# Patient Record
Sex: Female | Born: 1985
Health system: Southern US, Community
[De-identification: ages and names within clinical notes are randomized; demographics above are authoritative.]

## PROBLEM LIST (undated history)

## (undated) DIAGNOSIS — R519 Headache, unspecified: Secondary | ICD-10-CM

## (undated) DIAGNOSIS — J309 Allergic rhinitis, unspecified: Secondary | ICD-10-CM

## (undated) DIAGNOSIS — G8929 Other chronic pain: Secondary | ICD-10-CM

## (undated) DIAGNOSIS — N809 Endometriosis, unspecified: Secondary | ICD-10-CM

## (undated) DIAGNOSIS — T4145XA Adverse effect of unspecified anesthetic, initial encounter: Secondary | ICD-10-CM

## (undated) DIAGNOSIS — T8859XA Other complications of anesthesia, initial encounter: Secondary | ICD-10-CM

## (undated) DIAGNOSIS — E041 Nontoxic single thyroid nodule: Secondary | ICD-10-CM

## (undated) HISTORY — DX: Allergic rhinitis, unspecified: J30.9

## (undated) HISTORY — PX: LAPAROSCOPY: SHX197

## (undated) HISTORY — DX: Headache, unspecified: R51.9

## (undated) HISTORY — DX: Endometriosis, unspecified: N80.9

## (undated) HISTORY — DX: Nontoxic single thyroid nodule: E04.1

## (undated) HISTORY — PX: WISDOM TOOTH EXTRACTION: SHX21

## (undated) HISTORY — DX: Other chronic pain: G89.29

---
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
## 2019-01-28 DIAGNOSIS — Z6820 Body mass index (BMI) 20.0-20.9, adult: Secondary | ICD-10-CM | POA: Diagnosis not present

## 2019-01-28 DIAGNOSIS — N801 Endometriosis of ovary: Secondary | ICD-10-CM | POA: Diagnosis not present

## 2019-01-28 DIAGNOSIS — K130 Diseases of lips: Secondary | ICD-10-CM | POA: Diagnosis not present

## 2019-04-15 DIAGNOSIS — Z20828 Contact with and (suspected) exposure to other viral communicable diseases: Secondary | ICD-10-CM | POA: Diagnosis not present

## 2019-07-08 DIAGNOSIS — J0100 Acute maxillary sinusitis, unspecified: Secondary | ICD-10-CM | POA: Diagnosis not present

## 2019-07-08 DIAGNOSIS — Z681 Body mass index (BMI) 19 or less, adult: Secondary | ICD-10-CM | POA: Diagnosis not present

## 2019-09-16 DIAGNOSIS — Z8616 Personal history of COVID-19: Secondary | ICD-10-CM

## 2019-09-16 HISTORY — DX: Personal history of COVID-19: Z86.16

## 2019-09-22 DIAGNOSIS — U071 COVID-19: Secondary | ICD-10-CM | POA: Diagnosis not present

## 2019-09-22 DIAGNOSIS — Z20828 Contact with and (suspected) exposure to other viral communicable diseases: Secondary | ICD-10-CM | POA: Diagnosis not present

## 2019-09-22 DIAGNOSIS — Z03818 Encounter for observation for suspected exposure to other biological agents ruled out: Secondary | ICD-10-CM | POA: Diagnosis not present

## 2019-10-29 DIAGNOSIS — J309 Allergic rhinitis, unspecified: Secondary | ICD-10-CM | POA: Diagnosis not present

## 2019-10-29 DIAGNOSIS — J0191 Acute recurrent sinusitis, unspecified: Secondary | ICD-10-CM | POA: Diagnosis not present

## 2019-10-29 DIAGNOSIS — R221 Localized swelling, mass and lump, neck: Secondary | ICD-10-CM | POA: Diagnosis not present

## 2019-11-05 DIAGNOSIS — Z6821 Body mass index (BMI) 21.0-21.9, adult: Secondary | ICD-10-CM | POA: Diagnosis not present

## 2019-11-05 DIAGNOSIS — R5383 Other fatigue: Secondary | ICD-10-CM | POA: Diagnosis not present

## 2019-11-05 DIAGNOSIS — Z13220 Encounter for screening for lipoid disorders: Secondary | ICD-10-CM | POA: Diagnosis not present

## 2019-11-05 DIAGNOSIS — U071 COVID-19: Secondary | ICD-10-CM | POA: Diagnosis not present

## 2019-11-19 DIAGNOSIS — R5383 Other fatigue: Secondary | ICD-10-CM | POA: Diagnosis not present

## 2019-11-19 DIAGNOSIS — E041 Nontoxic single thyroid nodule: Secondary | ICD-10-CM | POA: Diagnosis not present

## 2019-11-19 DIAGNOSIS — R221 Localized swelling, mass and lump, neck: Secondary | ICD-10-CM | POA: Diagnosis not present

## 2019-11-19 DIAGNOSIS — J0191 Acute recurrent sinusitis, unspecified: Secondary | ICD-10-CM | POA: Diagnosis not present

## 2019-11-22 ENCOUNTER — Other Ambulatory Visit: Payer: Self-pay | Admitting: Otolaryngology

## 2019-11-22 DIAGNOSIS — E041 Nontoxic single thyroid nodule: Secondary | ICD-10-CM

## 2019-11-30 DIAGNOSIS — J0191 Acute recurrent sinusitis, unspecified: Secondary | ICD-10-CM | POA: Diagnosis not present

## 2019-11-30 DIAGNOSIS — J342 Deviated nasal septum: Secondary | ICD-10-CM | POA: Diagnosis not present

## 2019-12-02 ENCOUNTER — Ambulatory Visit
Admission: RE | Admit: 2019-12-02 | Discharge: 2019-12-02 | Disposition: A | Discharge status: Home / Self Care | Payer: BC Managed Care – PPO | Source: Ambulatory Visit | Attending: Otolaryngology | Admitting: Otolaryngology

## 2019-12-02 DIAGNOSIS — E041 Nontoxic single thyroid nodule: Secondary | ICD-10-CM | POA: Diagnosis not present

## 2019-12-08 ENCOUNTER — Other Ambulatory Visit: Payer: Self-pay | Admitting: Otolaryngology

## 2019-12-08 DIAGNOSIS — E041 Nontoxic single thyroid nodule: Secondary | ICD-10-CM

## 2019-12-16 DIAGNOSIS — C801 Malignant (primary) neoplasm, unspecified: Secondary | ICD-10-CM

## 2019-12-16 HISTORY — DX: Malignant (primary) neoplasm, unspecified: C80.1

## 2019-12-17 DIAGNOSIS — R5383 Other fatigue: Secondary | ICD-10-CM | POA: Diagnosis not present

## 2019-12-23 ENCOUNTER — Other Ambulatory Visit: Payer: Self-pay

## 2019-12-23 ENCOUNTER — Telehealth: Payer: Self-pay | Admitting: Physician Assistant

## 2019-12-23 ENCOUNTER — Ambulatory Visit
Admission: RE | Admit: 2019-12-23 | Discharge: 2019-12-23 | Disposition: A | Discharge status: Home / Self Care | Payer: BC Managed Care – PPO | Source: Ambulatory Visit | Attending: Otolaryngology | Admitting: Otolaryngology

## 2019-12-23 ENCOUNTER — Other Ambulatory Visit (HOSPITAL_COMMUNITY)
Admission: RE | Admit: 2019-12-23 | Discharge: 2019-12-23 | Disposition: A | Discharge status: Home / Self Care | Payer: BC Managed Care – PPO | Source: Ambulatory Visit | Attending: Physician Assistant | Admitting: Physician Assistant

## 2019-12-23 DIAGNOSIS — C73 Malignant neoplasm of thyroid gland: Secondary | ICD-10-CM | POA: Insufficient documentation

## 2019-12-23 DIAGNOSIS — E041 Nontoxic single thyroid nodule: Secondary | ICD-10-CM

## 2019-12-24 LAB — CYTOLOGY - NON PAP

## 2019-12-29 ENCOUNTER — Other Ambulatory Visit: Payer: Self-pay | Admitting: Physician Assistant

## 2019-12-29 ENCOUNTER — Other Ambulatory Visit (HOSPITAL_COMMUNITY): Payer: Self-pay | Admitting: Physician Assistant

## 2019-12-29 ENCOUNTER — Encounter: Payer: Self-pay | Admitting: *Deleted

## 2019-12-29 DIAGNOSIS — C73 Malignant neoplasm of thyroid gland: Secondary | ICD-10-CM

## 2019-12-29 NOTE — Progress Notes (Signed)
[facility]    Provider:  Dr Jaynee Eagles Requesting Provider: Izora Gala MD Primary Care Provider:  Sharilyn Sites, MD  CC:  Left temporal headache  HPI:  Melinda Jones is a 34 y.o. female here as requested by Izora Gala MD. for chronic headaches.  I reviewed Dr. Janeice Robinson notes, she moved from [place] this past year, she had Covid about a month ago (appointment with Dr. Constance Holster Oct 29, 2019), she has had a cough ever since, history of chronic allergies, she is allergic to multiple things, she reported having recurrent sinus infections, when she has a sinus infection she usually has left-sided periorbital maxillary pain and pressure and also a lump on the posterior lower neck at the midline which has been present and slowly getting larger for several years.  She has a past medical history of endometriosis.  Examination showed some mucosal edema in the nose worse on the right with some mucoid secretions, oral cavity normal, eyes ears hearing normal, oropharynx normal, and the neck was a lipomatous mass no thyroid nodules or enlargement, neuro examination cranial nerves II through XII were normal, she was treated for acute sinusitis with antibiotics.  At follow-up she reported her symptoms had not really changed very much, she still reported left temporal and forehead pain and pressure, her cough was not better, they proceeded with CT of the sinuses and check a thyroid level.  An ultrasound showed a 2.2 mm mass in the right thyroid that meets criteria for biopsy and she was set up for fine-needle aspiration, CT of the sinuses was negative, she had an aunt with brain cancer and she was referred to neurology for work-up.  Patient is here with parents who also provide information. Diagnosed with papillary carcinoma. She has had a consistent pressure in the left temporal lobe. Sometimes worse than others. Started in Fall of 2019. Starts left periorbital and temporal area, pressure, mild oncoming  and slowly progressive in nature and then it can last all day long. It can get throbbing and pulsating, heat makes it worse, light/sound sensitivity, nausea, hurts to move, being in a room alone helps, her twin sister has migraines and gluten may have made it worse, patient reports she does not have an aura. She also occ has a sensation in the back of her head, always on the left, so the pain spreads all along the left side, she holds the side of her face. On average 4/10, when it escalates it is a 5-6/10, can be daily. She is having headaches 20/30 headache days a month, 8 migraine days a month that are moderately severe to severe. She takes excedrin but only a few times a month. She had Covid this past Easter but that had nothing to do with the headaches.  She has nausea when the migraines are severe. No pregnacy plans. The headache can be positional with vision changes of the left eye. No other focal neurologic deficits, associated symptoms, inciting events or modifiable factors.  Reviewed notes, labs and imaging from outside physicians, which showed:  Se above  Review of Systems: Patient complains of symptoms per HPI as well as the following symptoms: thyroid cancer. Pertinent negatives and positives per HPI. All others negative.   Social History   Socioeconomic History  . Marital status: Single    Spouse name: Not on file  . Number of children: 0  . Years of education: Not on file  . Highest education level: Master's degree (e.g., MA, MS, MEng, MEd, MSW,  MBA)  Occupational History  . Not on file  Tobacco Use  . Smoking status: Never Smoker  . Smokeless tobacco: Never Used  Substance and Sexual Activity  . Alcohol use: Not Currently  . Drug use: Not Currently  . Sexual activity: Not on file  Other Topics Concern  . Not on file  Social History Narrative   lives with room mate   Caffeine, coffee 1 c   Social Determinants of Health   Financial Resource Strain:   . Difficulty of  Paying Living Expenses:   Food Insecurity:   . Worried About Charity fundraiser in the Last Year:   . Arboriculturist in the Last Year:   Transportation Needs:   . Film/video editor (Medical):   Marland Kitchen Lack of Transportation (Non-Medical):   Physical Activity:   . Days of Exercise per Week:   . Minutes of Exercise per Session:   Stress:   . Feeling of Stress :   Social Connections:   . Frequency of Communication with Friends and Family:   . Frequency of Social Gatherings with Friends and Family:   . Attends Religious Services:   . Active Member of Clubs or Organizations:   . Attends Archivist Meetings:   Marland Kitchen Marital Status:   Intimate Partner Violence:   . Fear of Current or Ex-Partner:   . Emotionally Abused:   Marland Kitchen Physically Abused:   . Sexually Abused:     Family History  Problem Relation Age of Onset  . Diabetes Mellitus I Mother   . Migraines Sister   . Other Maternal Aunt        brain tumor  . Cancer Maternal Grandmother   . Heart failure Maternal Grandfather   . Stroke Paternal Grandmother   . Dementia Paternal Grandfather     Past Medical History:  Diagnosis Date  . Allergic sinusitis   . Cancer ([place]) 12/2019   thyroid  . Chronic headaches   . Endometriosis   . History of COVID-19 09/2019  . Thyroid nodule     Patient Active Problem List   Diagnosis Date Noted  . Chronic migraine without aura without status migrainosus, not intractable 12/30/2019  . Papillary thyroid carcinoma ([place]) 12/30/2019    Past Surgical History:  Procedure Laterality Date  . LAPAROSCOPY    . WISDOM TOOTH EXTRACTION      Current Outpatient Medications  Medication Sig Dispense Refill  . levonorgestrel-ethinyl estradiol (AMETHYST) 90-20 MCG tablet     . loratadine (CLARITIN) 10 MG tablet Take 10 mg by mouth daily.    . S-Adenosylmethionine (SAM-E PO) Take by mouth.    . rizatriptan (MAXALT-MLT) 10 MG disintegrating tablet Take 1 tablet (10 mg total) by mouth as  needed for migraine. May repeat in 2 hours if needed 9 tablet 11   No current facility-administered medications for this visit.    Allergies as of 12/30/2019 - Review Complete 12/30/2019  Allergen Reaction Noted  . Amoxicillin-pot clavulanate Hives 10/29/2019  . Cefaclor Hives 10/29/2019  . Iodine Hives 12/30/2019    Vitals: BP 130/84   Pulse 68   Ht 5\' 9"  (1.753 m)   Wt 141 lb (64 kg)   BMI 20.82 kg/m  Last Weight:  Wt Readings from Last 1 Encounters:  12/30/19 141 lb (64 kg)   Last Height:   Ht Readings from Last 1 Encounters:  12/30/19 5\' 9"  (1.753 m)     Physical exam: Exam: Gen: NAD, conversant, well  nourised, well groomed                     CV: RRR, no MRG. No Carotid Bruits. No peripheral edema, warm, nontender Eyes: Conjunctivae clear without exudates or hemorrhage  Neuro: Detailed Neurologic Exam  Speech:    Speech is normal; fluent and spontaneous with normal comprehension.  Cognition:    The patient is oriented to person, place, and time;     recent and remote memory intact;     language fluent;     normal attention, concentration,     fund of knowledge Cranial Nerves:    The pupils are equal, round, and reactive to light. The fundi are normal and spontaneous venous pulsations are present. Visual fields are full to finger confrontation. Extraocular movements are intact. Trigeminal sensation is intact and the muscles of mastication are normal. The face is symmetric. The palate elevates in the midline. Hearing intact. Voice is normal. Shoulder shrug is normal. The tongue has normal motion without fasciculations.   Coordination:    No dysmetria or ataxia  Gait:    Normal native gait  Motor Observation:    No asymmetry, no atrophy, and no involuntary movements noted. Tone:    Normal muscle tone.    Posture:    Posture is normal. normal erect    Strength:    Strength is V/V in the upper and lower limbs.      Sensation: intact to LT     Reflex  Exam:  DTR's:    Deep tendon reflexes in the upper and lower extremities are normal bilaterally.   Toes:    The toes are downgoing bilaterally.   Clonus:    Clonus is absent.    Assessment/Plan: This is a really lovely 34 year old with 2 years of left-sided headaches likely migraines however given her recent diagnosis of head/neck cancer, and positional quality with some vision changes in the left eye I do think an MRI of the brain with and without contrast would be very prudent to make sure there are no metastases, space-occupying lesions, IH or other etiology in the brain, head or globe of the eye.  I had a long discussion with patient and both her parents about migraine management, options, lifestyle choices, preventative/acute management, devices on the market.  At this time we will give her acute medication, I would prefer that she have the MRI of the brain prior to deciding on preventative care and that she follows up with oncology and gets her PET scan as well prior to deciding on any preventative care at this time.   Preventative: Consider Topiramate, Ajovy (please see literature provided for many options)  Migraine journal  Recommend eye examination  MRI of the brain w/wo contrast  Consider joining facebook groups  Migraineurs often have a lot of neck stiffness or jaw clenching, be more aware, physical therapy can help  Magnesium citrate 400mg  to 600mg  daily, riboflavin 400mg  daily, Coenzyme Q 10 100mg  three times daily or Migrelief (on [employer])- Magnesium/ Vitamin B2 and feverfew     Orders Placed This Encounter  Procedures  . MR BRAIN W WO CONTRAST  . CBC  . Comprehensive metabolic panel   Meds ordered this encounter  Medications  . rizatriptan (MAXALT-MLT) 10 MG disintegrating tablet    Sig: Take 1 tablet (10 mg total) by mouth as needed for migraine. May repeat in 2 hours if needed    Dispense:  9 tablet    Refill:  11   To prevent or relieve headaches, try the  following: Cool Compress. Lie down and place a cool compress on your head.  Avoid headache triggers. If certain foods or odors seem to have triggered your migraines in the past, avoid them. A headache diary might help you identify triggers.  Include physical activity in your daily routine. Try a daily walk or other moderate aerobic exercise.  Manage stress. Find healthy ways to cope with the stressors, such as delegating tasks on your to-do list.  Practice relaxation techniques. Try deep breathing, yoga, massage and visualization.  Eat regularly. Eating regularly scheduled meals and maintaining a healthy diet might help prevent headaches. Also, drink plenty of fluids.  Follow a regular sleep schedule. Sleep deprivation might contribute to headaches Consider biofeedback. With this mind-body technique, you learn to control certain bodily functions -- such as muscle tension, heart rate and blood pressure -- to prevent headaches or reduce headache pain.    Proceed to emergency room if you experience new or worsening symptoms or symptoms do not resolve, if you have new neurologic symptoms or if headache is severe, or for any concerning symptom.   Provided education and documentation from American headache Society toolbox including articles on: chronic migraine medication overuse headache, chronic migraines, prevention of migraines, behavioral and other nonpharmacologic treatments for headache.   Cc: Sharilyn Sites, MD,  Dr. Maryln Manuel, [facility] [address] [place], [place] [address]  Phone [phone] Fax [phone]

## 2019-12-30 ENCOUNTER — Encounter: Payer: Self-pay | Admitting: Neurology

## 2019-12-30 ENCOUNTER — Ambulatory Visit: Payer: BC Managed Care – PPO | Admitting: Neurology

## 2019-12-30 ENCOUNTER — Telehealth: Payer: Self-pay | Admitting: Neurology

## 2019-12-30 ENCOUNTER — Other Ambulatory Visit: Payer: Self-pay

## 2019-12-30 VITALS — BP 130/84 | HR 68 | Ht 69.0 in | Wt 141.0 lb

## 2019-12-30 DIAGNOSIS — G43709 Chronic migraine without aura, not intractable, without status migrainosus: Secondary | ICD-10-CM | POA: Diagnosis not present

## 2019-12-30 DIAGNOSIS — C73 Malignant neoplasm of thyroid gland: Secondary | ICD-10-CM | POA: Insufficient documentation

## 2019-12-30 DIAGNOSIS — H539 Unspecified visual disturbance: Secondary | ICD-10-CM | POA: Diagnosis not present

## 2019-12-30 DIAGNOSIS — R51 Headache with orthostatic component, not elsewhere classified: Secondary | ICD-10-CM

## 2019-12-30 DIAGNOSIS — G441 Vascular headache, not elsewhere classified: Secondary | ICD-10-CM

## 2019-12-30 DIAGNOSIS — C760 Malignant neoplasm of head, face and neck: Secondary | ICD-10-CM

## 2019-12-30 MED ORDER — RIZATRIPTAN BENZOATE 10 MG PO TBDP
10.0000 mg | ORAL_TABLET | ORAL | 11 refills | Status: AC | PRN
Start: 2019-12-30 — End: ?

## 2019-12-30 NOTE — Patient Instructions (Addendum)
Acute management:Rizatriptan: Please take one tablet at the onset of your headache. If it does not improve the symptoms please take one additional tablet. Do not take more then 2 tablets in 24hrs. Do not take use more then 2 to 3 times in a week.  Preventative: Consider Topiramate, Ajovy (please see literature provided for many options)  Migraine journal  Recommend eye examination  MRI of the brain w/wo contrast  Consider joining facebook groups  Migraineurs often have a lot of neck stiffness or jaw clenching, be more aware, physical therapy can help  Magnesium citrate 400mg  to 600mg  daily, riboflavin 400mg  daily, Coenzyme Q 10 100mg  three times daily or Migrelief (on [employer])- Magnesium/ Vitamin B2 and feverfew    Migraine Headache A migraine headache is an intense, throbbing pain on one side or both sides of the head. Migraine headaches may also cause other symptoms, such as nausea, vomiting, and sensitivity to light and noise. A migraine headache can last from 4 hours to 3 days. Talk with your doctor about what things may bring on (trigger) your migraine headaches. What are the causes? The exact cause of this condition is not known. However, a migraine may be caused when nerves in the brain become irritated and release chemicals that cause inflammation of blood vessels. This inflammation causes pain. This condition may be triggered or caused by:  Drinking alcohol.  Smoking.  Taking medicines, such as: ? Medicine used to treat chest pain (nitroglycerin). ? Birth control pills. ? Estrogen. ? Certain blood pressure medicines.  Eating or drinking products that contain nitrates, glutamate, aspartame, or tyramine. Aged cheeses, chocolate, or caffeine may also be triggers.  Doing physical activity. Other things that may trigger a migraine headache include:  Menstruation.  Pregnancy.  Hunger.  Stress.  Lack of sleep or too much sleep.  Weather changes.  Fatigue. What  increases the risk? The following factors may make you more likely to experience migraine headaches:  Being a certain age. This condition is more common in people who are 35-78 years old.  Being female.  Having a family history of migraine headaches.  Being Caucasian.  Having a mental health condition, such as depression or anxiety.  Being obese. What are the signs or symptoms? The main symptom of this condition is pulsating or throbbing pain. This pain may:  Happen in any area of the head, such as on one side or both sides.  Interfere with daily activities.  Get worse with physical activity.  Get worse with exposure to bright lights or loud noises. Other symptoms may include:  Nausea.  Vomiting.  Dizziness.  General sensitivity to bright lights, loud noises, or smells. Before you get a migraine headache, you may get warning signs (an aura). An aura may include:  Seeing flashing lights or having blind spots.  Seeing bright spots, halos, or zigzag lines.  Having tunnel vision or blurred vision.  Having numbness or a tingling feeling.  Having trouble talking.  Having muscle weakness. Some people have symptoms after a migraine headache (postdromal phase), such as:  Feeling tired.  Difficulty concentrating. How is this diagnosed? A migraine headache can be diagnosed based on:  Your symptoms.  A physical exam.  Tests, such as: ? CT scan or an MRI of the head. These imaging tests can help rule out other causes of headaches. ? Taking fluid from the spine (lumbar puncture) and analyzing it (cerebrospinal fluid analysis, or CSF analysis). How is this treated? This condition may be treated with  medicines that:  Relieve pain.  Relieve nausea.  Prevent migraine headaches. Treatment for this condition may also include:  Acupuncture.  Lifestyle changes like avoiding foods that trigger migraine headaches.  Biofeedback.  Cognitive behavioral  therapy. Follow these instructions at home: Medicines  Take over-the-counter and prescription medicines only as told by your health care provider.  Ask your health care provider if the medicine prescribed to you: ? Requires you to avoid driving or using heavy machinery. ? Can cause constipation. You may need to take these actions to prevent or treat constipation:  Drink enough fluid to keep your urine pale yellow.  Take over-the-counter or prescription medicines.  Eat foods that are high in fiber, such as beans, whole grains, and fresh fruits and vegetables.  Limit foods that are high in fat and processed sugars, such as fried or sweet foods. Lifestyle  Do not drink alcohol.  Do not use any products that contain nicotine or tobacco, such as cigarettes, e-cigarettes, and chewing tobacco. If you need help quitting, ask your health care provider.  Get at least 8 hours of sleep every night.  Find ways to manage stress, such as meditation, deep breathing, or yoga. General instructions      Keep a journal to find out what may trigger your migraine headaches. For example, write down: ? What you eat and drink. ? How much sleep you get. ? Any change to your diet or medicines.  If you have a migraine headache: ? Avoid things that make your symptoms worse, such as bright lights. ? It may help to lie down in a dark, quiet room. ? Do not drive or use heavy machinery. ? Ask your health care provider what activities are safe for you while you are experiencing symptoms.  Keep all follow-up visits as told by your health care provider. This is important. Contact a health care provider if:  You develop symptoms that are different or more severe than your usual migraine headache symptoms.  You have more than 15 headache days in one month. Get help right away if:  Your migraine headache becomes severe.  Your migraine headache lasts longer than 72 hours.  You have a fever.  You have  a stiff neck.  You have vision loss.  Your muscles feel weak or like you cannot control them.  You start to lose your balance often.  You have trouble walking.  You faint.  You have a seizure. Summary  A migraine headache is an intense, throbbing pain on one side or both sides of the head. Migraines may also cause other symptoms, such as nausea, vomiting, and sensitivity to light and noise.  This condition may be treated with medicines and lifestyle changes. You may also need to avoid certain things that trigger a migraine headache.  Keep a journal to find out what may trigger your migraine headaches.  Contact your health care provider if you have more than 15 headache days in a month or you develop symptoms that are different or more severe than your usual migraine headache symptoms. This information is not intended to replace advice given to you by your health care provider. Make sure you discuss any questions you have with your health care provider. Document Revised: 09/25/2018 Document Reviewed: 07/16/2018 Elsevier Patient Education  [place].   Rizatriptan disintegrating tablets What is this medicine? RIZATRIPTAN (rye za TRIP tan) is used to treat migraines with or without aura. An aura is a strange feeling or visual disturbance that  warns you of an attack. It is not used to prevent migraines. This medicine may be used for other purposes; ask your health care provider or pharmacist if you have questions. COMMON BRAND NAME(S): Maxalt-MLT What should I tell my health care provider before I take this medicine? They need to know if you have any of these conditions:  cigarette smoker  circulation problems in fingers and toes  diabetes  heart disease  high blood pressure  high cholesterol  history of irregular heartbeat  history of stroke  kidney disease  liver disease  stomach or intestine problems  an unusual or allergic reaction to rizatriptan,  other medicines, foods, dyes, or preservatives  pregnant or trying to get pregnant  breast-feeding How should I use this medicine? Take this medicine by mouth. Follow the directions on the prescription label. Leave the tablet in the sealed blister pack until you are ready to take it. With dry hands, open the blister and gently remove the tablet. If the tablet breaks or crumbles, throw it away and take a new tablet out of the blister pack. Place the tablet in the mouth and allow it to dissolve, and then swallow. Do not cut, crush, or chew this medicine. You do not need water to take this medicine. Do not take it more often than directed. Talk to your pediatrician regarding the use of this medicine in children. While this drug may be prescribed for children as young as 6 years for selected conditions, precautions do apply. Overdosage: If you think you have taken too much of this medicine contact a poison control center or emergency room at once. NOTE: This medicine is only for you. Do not share this medicine with others. What if I miss a dose? This does not apply. This medicine is not for regular use. What may interact with this medicine? Do not take this medicine with any of the following medicines:  certain medicines for migraine headache like almotriptan, eletriptan, frovatriptan, naratriptan, rizatriptan, sumatriptan, zolmitriptan  ergot alkaloids like dihydroergotamine, ergonovine, ergotamine, methylergonovine  MAOIs like Carbex, Eldepryl, Marplan, Nardil, and Parnate This medicine may also interact with the following medications:  certain medicines for depression, anxiety, or psychotic disorders  propranolol This list may not describe all possible interactions. Give your health care provider a list of all the medicines, herbs, non-prescription drugs, or dietary supplements you use. Also tell them if you smoke, drink alcohol, or use illegal drugs. Some items may interact with your  medicine. What should I watch for while using this medicine? Visit your healthcare professional for regular checks on your progress. Tell your healthcare professional if your symptoms do not start to get better or if they get worse. You may get drowsy or dizzy. Do not drive, use machinery, or do anything that needs mental alertness until you know how this medicine affects you. Do not stand up or sit up quickly, especially if you are an older patient. This reduces the risk of dizzy or fainting spells. Alcohol may interfere with the effect of this medicine. Your mouth may get dry. Chewing sugarless gum or sucking hard candy and drinking plenty of water may help. Contact your healthcare professional if the problem does not go away or is severe. If you take migraine medicines for 10 or more days a month, your migraines may get worse. Keep a diary of headache days and medicine use. Contact your healthcare professional if your migraine attacks occur more frequently. What side effects may I notice from receiving  this medicine? Side effects that you should report to your doctor or health care professional as soon as possible:  allergic reactions like skin rash, itching or hives, swelling of the face, lips, or tongue  chest pain or chest tightness  signs and symptoms of a dangerous change in heartbeat or heart rhythm like chest pain; dizziness; fast, irregular heartbeat; palpitations; feeling faint or lightheaded; falls; breathing problems  signs and symptoms of a stroke like changes in vision; confusion; trouble speaking or understanding; severe headaches; sudden numbness or weakness of the face, arm or leg; trouble walking; dizziness; loss of balance or coordination  signs and symptoms of serotonin syndrome like irritable; confusion; diarrhea; fast or irregular heartbeat; muscle twitching; stiff muscles; trouble walking; sweating; high fever; seizures; chills; vomiting Side effects that usually do not  require medical attention (report to your doctor or health care professional if they continue or are bothersome):  diarrhea  dizziness  drowsiness  dry mouth  headache  nausea, vomiting  pain, tingling, numbness in the hands or feet  stomach pain This list may not describe all possible side effects. Call your doctor for medical advice about side effects. You may report side effects to FDA at 1-800-FDA-1088. Where should I keep my medicine? Keep out of the reach of children. Store at room temperature between 15 and 30 degrees C (59 and 86 degrees F). Protect from light and moisture. Throw away any unused medicine after the expiration date. NOTE: This sheet is a summary. It may not cover all possible information. If you have questions about this medicine, talk to your doctor, pharmacist, or health care provider.  2020 Elsevier/Gold Standard (2017-12-16 14:58:08) Rolanda Lundborg injection What is this medicine? FREMANEZUMAB (fre ma NEZ ue mab) is used to prevent migraine headaches. This medicine may be used for other purposes; ask your health care provider or pharmacist if you have questions. COMMON BRAND NAME(S): AJOVY What should I tell my health care provider before I take this medicine? They need to know if you have any of these conditions:  an unusual or allergic reaction to fremanezumab, other medicines, foods, dyes, or preservatives  pregnant or trying to get pregnant  breast-feeding How should I use this medicine? This medicine is for injection under the skin. You will be taught how to prepare and give this medicine. Use exactly as directed. Take your medicine at regular intervals. Do not take your medicine more often than directed. It is important that you put your used needles and syringes in a special sharps container. Do not put them in a trash can. If you do not have a sharps container, call your pharmacist or healthcare provider to get one. Talk to your pediatrician  regarding the use of this medicine in children. Special care may be needed. Overdosage: If you think you have taken too much of this medicine contact a poison control center or emergency room at once. NOTE: This medicine is only for you. Do not share this medicine with others. What if I miss a dose? If you miss a dose, take it as soon as you can. If it is almost time for your next dose, take only that dose. Do not take double or extra doses. What may interact with this medicine? Interactions are not expected. This list may not describe all possible interactions. Give your health care provider a list of all the medicines, herbs, non-prescription drugs, or dietary supplements you use. Also tell them if you smoke, drink alcohol, or use illegal drugs.  Some items may interact with your medicine. What should I watch for while using this medicine? Tell your doctor or healthcare professional if your symptoms do not start to get better or if they get worse. What side effects may I notice from receiving this medicine? Side effects that you should report to your doctor or health care professional as soon as possible:  allergic reactions like skin rash, itching or hives, swelling of the face, lips, or tongue Side effects that usually do not require medical attention (report these to your doctor or health care professional if they continue or are bothersome):  pain, redness, or irritation at site where injected This list may not describe all possible side effects. Call your doctor for medical advice about side effects. You may report side effects to FDA at 1-800-FDA-1088. Where should I keep my medicine? Keep out of the reach of children. You will be instructed on how to store this medicine. Throw away any unused medicine after the expiration date on the label. NOTE: This sheet is a summary. It may not cover all possible information. If you have questions about this medicine, talk to your doctor, pharmacist,  or health care provider.  2020 Elsevier/Gold Standard (2017-03-03 17:22:56) Topiramate tablets What is this medicine? TOPIRAMATE (toe PYRE a mate) is used to treat seizures in adults or children with epilepsy. It is also used for the prevention of migraine headaches. This medicine may be used for other purposes; ask your health care provider or pharmacist if you have questions. COMMON BRAND NAME(S): Topamax, Topiragen What should I tell my health care provider before I take this medicine? They need to know if you have any of these conditions:  bleeding disorders  kidney disease  lung or breathing disease, like asthma  suicidal thoughts, plans, or attempt; a previous suicide attempt by you or a family member  an unusual or allergic reaction to topiramate, other medicines, foods, dyes, or preservatives  pregnant or trying to get pregnant  breast-feeding How should I use this medicine? Take this medicine by mouth with a glass of water. Follow the directions on the prescription label. Do not cut, crush or chew this medicine. Swallow the tablets whole. You can take it with or without food. If it upsets your stomach, take it with food. Take your medicine at regular intervals. Do not take it more often than directed. Do not stop taking except on your doctor's advice. A special MedGuide will be given to you by the pharmacist with each prescription and refill. Be sure to read this information carefully each time. Talk to your pediatrician regarding the use of this medicine in children. While this drug may be prescribed for children as young as 82 years of age for selected conditions, precautions do apply. Overdosage: If you think you have taken too much of this medicine contact a poison control center or emergency room at once. NOTE: This medicine is only for you. Do not share this medicine with others. What if I miss a dose? If you miss a dose, take it as soon as you can. If your next dose is  to be taken in less than 6 hours, then do not take the missed dose. Take the next dose at your regular time. Do not take double or extra doses. What may interact with this medicine? This medicine may interact with the following medications:  acetazolamide  alcohol  antihistamines for allergy, cough, and cold  aspirin and aspirin-like medicines  atropine  birth control  pills  certain medicines for anxiety or sleep  certain medicines for bladder problems like oxybutynin, tolterodine  certain medicines for depression like amitriptyline, fluoxetine, sertraline  certain medicines for seizures like carbamazepine, phenobarbital, phenytoin, primidone, valproic acid, zonisamide  certain medicines for stomach problems like dicyclomine, hyoscyamine  certain medicines for travel sickness like scopolamine  certain medicines for Parkinson's disease like benztropine, trihexyphenidyl  certain medicines that treat or prevent blood clots like warfarin, enoxaparin, dalteparin, apixaban, dabigatran, and rivaroxaban  digoxin  general anesthetics like halothane, isoflurane, methoxyflurane, propofol  hydrochlorothiazide  ipratropium  lithium  medicines that relax muscles for surgery  metformin  narcotic medicines for pain  NSAIDs, medicines for pain and inflammation, like ibuprofen or naproxen  phenothiazines like chlorpromazine, mesoridazine, prochlorperazine, thioridazine  pioglitazone This list may not describe all possible interactions. Give your health care provider a list of all the medicines, herbs, non-prescription drugs, or dietary supplements you use. Also tell them if you smoke, drink alcohol, or use illegal drugs. Some items may interact with your medicine. What should I watch for while using this medicine? Visit your doctor or health care professional for regular checks on your progress. Tell your health care professional if your symptoms do not start to get better or if  they get worse. Do not stop taking except on your health care professional's advice. You may develop a severe reaction. Your health care professional will tell you how much medicine to take. Wear a medical ID bracelet or chain. Carry a card that describes your disease and details of your medicine and dosage times. This medicine can reduce the response of your body to heat or cold. Dress warm in cold weather and stay hydrated in hot weather. If possible, avoid extreme temperatures like saunas, hot tubs, very hot or cold showers, or activities that can cause dehydration such as vigorous exercise. Check with your health care professional if you have severe diarrhea, nausea, and vomiting, or if you sweat a lot. The loss of too much body fluid may make it dangerous for you to take this medicine. You may get drowsy or dizzy. Do not drive, use machinery, or do anything that needs mental alertness until you know how this medicine affects you. Do not stand up or sit up quickly, especially if you are an older patient. This reduces the risk of dizzy or fainting spells. Alcohol may interfere with the effect of this medicine. Avoid alcoholic drinks. Tell your health care professional right away if you have any change in your eyesight. Patients and their families should watch out for new or worsening depression or thoughts of suicide. Also watch out for sudden changes in feelings such as feeling anxious, agitated, panicky, irritable, hostile, aggressive, impulsive, severely restless, overly excited and hyperactive, or not being able to sleep. If this happens, especially at the beginning of treatment or after a change in dose, call your healthcare professional. This medicine may cause serious skin reactions. They can happen weeks to months after starting the medicine. Contact your health care provider right away if you notice fevers or flu-like symptoms with a rash. The rash may be red or purple and then turn into blisters  or peeling of the skin. Or, you might notice a red rash with swelling of the face, lips or lymph nodes in your neck or under your arms. Birth control may not work properly while you are taking this medicine. Talk to your health care professional about using an extra method of birth control. Women  should inform their health care professional if they wish to become pregnant or think they might be pregnant. There is a potential for serious side effects and harm to an unborn child. Talk to your health care professional for more information. What side effects may I notice from receiving this medicine? Side effects that you should report to your doctor or health care professional as soon as possible:  allergic reactions like skin rash, itching or hives, swelling of the face, lips, or tongue  blood in the urine  changes in vision  confusion  loss of memory  pain in lower back or side  pain when urinating  redness, blistering, peeling or loosening of the skin, including inside the mouth  signs and symptoms of bleeding such as bloody or black, tarry stools; red or dark brown urine; spitting up blood or brown material that looks like coffee grounds; red spots on the skin; unusual bruising or bleeding from the eyes, gums, or nose  signs and symptoms of increased acid in the body like breathing fast; fast heartbeat; headache; confusion; unusually weak or tired; nausea, vomiting  suicidal thoughts, mood changes  trouble speaking or understanding  unusual sweating  unusually weak or tired Side effects that usually do not require medical attention (report to your doctor or health care professional if they continue or are bothersome):  dizziness  drowsiness  fever  loss of appetite  nausea, vomiting  pain, tingling, numbness in the hands or feet  stomach pain  tiredness  upset stomach This list may not describe all possible side effects. Call your doctor for medical advice about  side effects. You may report side effects to FDA at 1-800-FDA-1088. Where should I keep my medicine? Keep out of the reach of children. Store at room temperature between 15 and 30 degrees C (59 and 86 degrees F). Throw away any unused medicine after the expiration date. NOTE: This sheet is a summary. It may not cover all possible information. If you have questions about this medicine, talk to your doctor, pharmacist, or health care provider.  2020 Elsevier/Gold Standard (2018-12-31 15:07:20)

## 2019-12-30 NOTE — Telephone Encounter (Signed)
no to the covid questions MR Brain w/wo contrast Dr. Ihor Dow Auth: [id number] (exp. 12/30/19 to 06/26/20). Patient is scheduled at [facility] for 01/04/20.  Patient informed me that she has had a allergic reaction to Iodine.

## 2019-12-30 NOTE — Telephone Encounter (Addendum)
Per Dr Jaynee Eagles called patient and advised her Dr Jaynee Eagles will call in Rx for her to take prior to MRI next week. She  verbalized understanding, appreciation.

## 2020-01-02 ENCOUNTER — Other Ambulatory Visit: Payer: Self-pay | Admitting: Neurology

## 2020-01-02 MED ORDER — PREDNISONE 50 MG PO TABS: ORAL_TABLET | ORAL | 0 refills | Status: DC

## 2020-01-02 NOTE — Telephone Encounter (Signed)
I called in the prescription, prednisone 50mg . Instructions on the prescription but please review with patient Monday: Take 1 pill 13 hours, 7 hours  and 1 hour before MRI for a total of 3 doses of prednisone. Take 50mg  of Benadryl with the prednisone 1 hour before MRI for a total of one dose of benadryl.

## 2020-01-03 NOTE — Telephone Encounter (Signed)
Spoke with patient and advised her to write down Dr Cathren Laine instructions for premeds for MRI on Wed. She stated she got paper,pen, and I reviewed all instructions with her. She verbalized understanding, appreciation.

## 2020-01-04 ENCOUNTER — Other Ambulatory Visit: Payer: BC Managed Care – PPO

## 2020-01-05 ENCOUNTER — Other Ambulatory Visit: Payer: Self-pay

## 2020-01-05 ENCOUNTER — Ambulatory Visit: Payer: BC Managed Care – PPO

## 2020-01-05 DIAGNOSIS — C760 Malignant neoplasm of head, face and neck: Secondary | ICD-10-CM

## 2020-01-05 DIAGNOSIS — G441 Vascular headache, not elsewhere classified: Secondary | ICD-10-CM

## 2020-01-05 DIAGNOSIS — H539 Unspecified visual disturbance: Secondary | ICD-10-CM | POA: Diagnosis not present

## 2020-01-05 DIAGNOSIS — R51 Headache with orthostatic component, not elsewhere classified: Secondary | ICD-10-CM | POA: Diagnosis not present

## 2020-01-05 MED ORDER — GADOBENATE DIMEGLUMINE 529 MG/ML IV SOLN
13.0000 mL | Freq: Once | INTRAVENOUS | Status: AC | PRN
  Administered 2020-01-05: 13 mL via INTRAVENOUS

## 2020-01-06 ENCOUNTER — Encounter (HOSPITAL_COMMUNITY): Payer: Self-pay

## 2020-01-06 ENCOUNTER — Encounter (HOSPITAL_COMMUNITY): Payer: BC Managed Care – PPO

## 2020-01-07 DIAGNOSIS — C73 Malignant neoplasm of thyroid gland: Secondary | ICD-10-CM | POA: Diagnosis not present

## 2020-01-07 NOTE — H&P (Signed)
HPI:   Melinda Jones is a 34 y.o. female who presents as a new Patient.   Referring Provider: Self, A Referral  Chief complaint: Cough.  HPI: She moved here from [place] this past year. She had Covid about a month ago. She has had a cough ever since. She has a history of chronic allergies. She has been allergy tested twice in [place]. She is allergic to multiple things. She has recurrent sinus infections. According to her mother her right tonsil has been larger than the left for her entire life. When she has a sinus infection she usually has left-sided periorbital and maxillary pain and pressure. She also has a lump on the posterior lower neck at the midline which has been present and slowly getting larger for several years.  PMH/Meds/All/SocHx/FamHx/ROS:   Past Medical History:  Diagnosis Date  . Endometriosis  . History of COVID-19   Past Surgical History:  Procedure Laterality Date  . LAPAROSCOPY  . WISDOM TOOTH EXTRACTION   No family history of bleeding disorders, wound healing problems or difficulty with anesthesia.   Social History   Socioeconomic History  . Marital status: Unknown  Spouse name: Not on file  . Number of children: Not on file  . Years of education: Not on file  . Highest education level: Not on file  Occupational History  . Not on file  Tobacco Use  . Smoking status: Never Smoker  . Smokeless tobacco: Never Used  Substance and Sexual Activity  . Alcohol use: Not on file  . Drug use: Not on file  . Sexual activity: Not on file  Other Topics Concern  . Not on file  Social History Narrative  . Not on file   Social Determinants of Health   Financial Resource Strain:  . Difficulty of Paying Living Expenses:  Food Insecurity:  . Worried About Charity fundraiser in the Last Year:  . Arboriculturist in the Last Year:  Transportation Needs:  . Film/video editor (Medical):  Marland Kitchen Lack of Transportation (Non-Medical):  Physical Activity:  . Days of  Exercise per Week:  . Minutes of Exercise per Session:  Stress:  . Feeling of Stress :  Social Connections:  . Frequency of Communication with Friends and Family:  . Frequency of Social Gatherings with Friends and Family:  . Attends Religious Services:  . Active Member of Clubs or Organizations:  . Attends Archivist Meetings:  Marland Kitchen Marital Status:   Current Outpatient Medications:  . AMETHYST, 28, 90-20 mcg (28) per tablet, , Disp: , Rfl:  . fluticasone propionate (FLOVENT HFA) 110 mcg/actuation inhaler, Inhale 110 mcg into the lungs 2 times daily., Disp: , Rfl:  . loratadine (CLARITIN) 10 mg tablet, Take 10 mg by mouth daily., Disp: , Rfl:   A complete ROS was performed with pertinent positives/negatives noted in the HPI. The remainder of the ROS are negative.   Physical Exam:   BP 160/96  Pulse 67  Temp 97.1 F (36.2 C)  Ht 1.753 m (5\' 9" )  Wt 64 kg (141 lb)  BMI 20.82 kg/m   General: Healthy and alert, in no distress, breathing easily. Normal affect. In a pleasant mood. Head: Normocephalic, atraumatic. No masses, or scars. Eyes: Pupils are equal, and reactive to light. Vision is grossly intact. No spontaneous or gaze nystagmus. Ears: Ear canals are clear. Tympanic membranes are intact, with normal landmarks and the middle ears are clear and healthy. Hearing: Grossly normal. Nose: Nasal cavities  reveal mucosal edema worse on the right with some mucoid secretions with some slight discoloration. Face: No masses or scars, facial nerve function is symmetric. Oral Cavity: No mucosal abnormalities are noted. Tongue with normal mobility. Dentition appears healthy. Oropharynx: Tonsils are symmetric. There are no mucosal masses identified. Tongue base appears normal and healthy. Larynx/Hypopharynx: indirect exam reveals healthy, mobile vocal cords, without mucosal lesions in the hypopharynx or larynx. Chest: Deferred Neck: 6-7 cm lipomatous mass at the base of the neck  posteriorly at the midline, no cervical adenopathy, no thyroid nodules or enlargement. Neuro: Cranial nerves II-XII with normal function. Balance: Normal gate. Other findings: none.  Independent Review of Additional Tests or Records:  none  Procedures:  none  Impression & Plans:  1 month history of chronic cough since having Covid. She was last on antibiotics for her sinuses in February. I think she may have an acute sinusitis right now. Recommend clindamycin for 10 days.We discussed the importance of eating active or live culture yoghurt 2-3 times daily while taking the antibiotics. This can help avoid GI side effects. Follow-up in 3 weeks for recheck at which point we may want to do some sinus CT scans.  Chronic allergies. Recommend we refer her to a local allergist for continued care.  Posterior neck mass likely lipoma. She can have this removed if she desires.    I had a long talk with her mother today. I explained the results of the biopsy. She is having an MRI of the brain this week requested by the neurologist for the chronic headaches. She had a PET scan scheduled today but that has now been canceled which I agree with. I explained that the best treatment for thyroid carcinoma is total thyroidectomy followed by endocrinology follow-up with radioiodine scanning and treatment if necessary. We discussed the nature of the surgery and the risks of recurrent nerve injury and hypocalcemia. I have invited her and lowered to call back at any time to ask questions otherwise we will schedule as soon as possible.

## 2020-01-10 NOTE — Progress Notes (Signed)
[id number] [address], [place] [address] Phone: [phone] Fax: [phone]      Your procedure is scheduled on Monday, August 2nd.  Report to [facility] Main Entrance "A" at 7:00 A.M., and check in at the Admitting office.  Call this number if you have problems the morning of surgery:  [phone]  Call [phone] if you have any questions prior to your surgery date Monday-Friday 8am-4pm    Remember:  Do not eat or drink after midnight the night before your surgery     Take these medicines the morning of surgery with A SIP OF WATER   Tylenol - if needed  Flonase nasal spray - if needed  Claritin - if needed    As of today, STOP taking any Aspirin (unless otherwise instructed by your surgeon) Aleve, Naproxen, Ibuprofen, Motrin, Advil, Goody's, BC's, all herbal medications, fish oil, and all vitamins.                      Do not wear jewelry, make up, or nail polish            Do not wear lotions, powders, perfumes, or deodorant.            Do not shave 48 hours prior to surgery.              Do not bring valuables to the hospital.            [facility] is not responsible for any belongings or valuables.  Do NOT Smoke (Tobacco/Vaping) or drink Alcohol 24 hours prior to your procedure If you use a CPAP at night, you may bring all equipment for your overnight stay.   Contacts, glasses, dentures or bridgework may not be worn into surgery.      For patients admitted to the hospital, discharge time will be determined by your treatment team.   Patients discharged the day of surgery will not be allowed to drive home, and someone needs to stay with them for 24 hours.    Special instructions:   [facility]- Preparing For Surgery  Before surgery, you can play an important role. Because skin is not sterile, your skin needs to be as free of germs as possible. You can reduce the number of germs on your skin by  washing with CHG (chlorahexidine gluconate) Soap before surgery.  CHG is an antiseptic cleaner which kills germs and bonds with the skin to continue killing germs even after washing.    Oral Hygiene is also important to reduce your risk of infection.  Remember - BRUSH YOUR TEETH THE MORNING OF SURGERY WITH YOUR REGULAR TOOTHPASTE  Please do not use if you have an allergy to CHG or antibacterial soaps. If your skin becomes reddened/irritated stop using the CHG.  Do not shave (including legs and underarms) for at least 48 hours prior to first CHG shower. It is OK to shave your face.  Please follow these instructions carefully.   1. Shower the NIGHT BEFORE SURGERY and the MORNING OF SURGERY with CHG Soap.   2. If you chose to wash your hair, wash your hair first as usual with your normal shampoo.  3. After you shampoo, rinse your hair and body thoroughly to remove the shampoo.  4. Use CHG as you would any other liquid soap. You can apply CHG directly to the skin and wash gently with a scrungie or a clean washcloth.  5. Apply the CHG Soap to your body ONLY FROM THE NECK DOWN.  Do not use on open wounds or open sores. Avoid contact with your eyes, ears, mouth and genitals (private parts). Wash Face and genitals (private parts)  with your normal soap.   6. Wash thoroughly, paying special attention to the area where your surgery will be performed.  7. Thoroughly rinse your body with warm water from the neck down.  8. DO NOT shower/wash with your normal soap after using and rinsing off the CHG Soap.  9. Pat yourself dry with a CLEAN TOWEL.  10. Wear CLEAN PAJAMAS to bed the night before surgery  11. Place CLEAN SHEETS on your bed the night of your first shower and DO NOT SLEEP WITH PETS.   Day of Surgery: Wear Clean/Comfortable clothing the morning of surgery Do not apply any deodorants/lotions.   Remember to brush your teeth WITH YOUR REGULAR TOOTHPASTE.   Please read over the  following fact sheets that you were given.

## 2020-01-11 ENCOUNTER — Other Ambulatory Visit: Payer: Self-pay

## 2020-01-11 ENCOUNTER — Encounter (HOSPITAL_COMMUNITY)
Admission: RE | Admit: 2020-01-11 | Discharge: 2020-01-11 | Disposition: A | Discharge status: Home / Self Care | Payer: BC Managed Care – PPO | Source: Ambulatory Visit | Attending: Otolaryngology | Admitting: Otolaryngology

## 2020-01-11 ENCOUNTER — Encounter (HOSPITAL_COMMUNITY): Payer: Self-pay

## 2020-01-11 DIAGNOSIS — Z01812 Encounter for preprocedural laboratory examination: Secondary | ICD-10-CM | POA: Diagnosis not present

## 2020-01-11 LAB — CBC
HCT: 47.7 % — ABNORMAL HIGH (ref 36.0–46.0)
Hemoglobin: 15.2 g/dL — ABNORMAL HIGH (ref 12.0–15.0)
MCH: 29.7 pg (ref 26.0–34.0)
MCHC: 31.9 g/dL (ref 30.0–36.0)
MCV: 93.2 fL (ref 80.0–100.0)
Platelets: 314 10*3/uL (ref 150–400)
RBC: 5.12 MIL/uL — ABNORMAL HIGH (ref 3.87–5.11)
RDW: 11.9 % (ref 11.5–15.5)
WBC: 9.9 10*3/uL (ref 4.0–10.5)
nRBC: 0 % (ref 0.0–0.2)

## 2020-01-11 LAB — BASIC METABOLIC PANEL
Anion gap: 12 (ref 5–15)
BUN: 5 mg/dL — ABNORMAL LOW (ref 6–20)
CO2: 26 mmol/L (ref 22–32)
Calcium: 9.2 mg/dL (ref 8.9–10.3)
Chloride: 101 mmol/L (ref 98–111)
Creatinine, Ser: 0.62 mg/dL (ref 0.44–1.00)
GFR calc Af Amer: >60 mL/min (ref >60)
GFR calc non Af Amer: >60 mL/min (ref >60)
Glucose, Bld: 138 mg/dL — ABNORMAL HIGH (ref 70–99)
Potassium: 3.7 mmol/L (ref 3.5–5.1)
Sodium: 139 mmol/L (ref 135–145)

## 2020-01-11 NOTE — Progress Notes (Signed)
PCP - Dr. Eddie Candle Cardiologist - Denies  Chest x-ray - Not indicated EKG - Not indicated Stress Test - Denies ECHO - Denies Cardiac Cath - Denies  Sleep Study - Denies  DM - Denies  COVID TEST- 01/11/20  Anesthesia review: No  Patient denies shortness of breath, fever, cough and chest pain at PAT appointment   All instructions explained to the patient, with a verbal understanding of the material. Patient agrees to go over the instructions while at home for a better understanding. Patient also instructed to self quarantine after being tested for COVID-19. The opportunity to ask questions was provided.

## 2020-01-14 ENCOUNTER — Other Ambulatory Visit (HOSPITAL_COMMUNITY)
Admission: RE | Admit: 2020-01-14 | Discharge: 2020-01-14 | Disposition: A | Discharge status: Home / Self Care | Payer: BC Managed Care – PPO | Source: Ambulatory Visit | Attending: Otolaryngology | Admitting: Otolaryngology

## 2020-01-14 DIAGNOSIS — Z20822 Contact with and (suspected) exposure to covid-19: Secondary | ICD-10-CM | POA: Insufficient documentation

## 2020-01-14 DIAGNOSIS — Z01812 Encounter for preprocedural laboratory examination: Secondary | ICD-10-CM | POA: Diagnosis not present

## 2020-01-14 LAB — SARS CORONAVIRUS 2 (TAT 6-24 HRS): SARS Coronavirus 2: NEGATIVE

## 2020-01-17 ENCOUNTER — Encounter (HOSPITAL_COMMUNITY): Payer: Self-pay | Admitting: Otolaryngology

## 2020-01-17 ENCOUNTER — Ambulatory Visit (HOSPITAL_COMMUNITY): Payer: BC Managed Care – PPO | Admitting: Certified Registered Nurse Anesthetist

## 2020-01-17 ENCOUNTER — Other Ambulatory Visit: Payer: Self-pay

## 2020-01-17 ENCOUNTER — Encounter (HOSPITAL_COMMUNITY)
Admission: RE | Disposition: A | Discharge status: Home / Self Care | Payer: Self-pay | Source: Ambulatory Visit | Attending: Otolaryngology

## 2020-01-17 ENCOUNTER — Observation Stay (HOSPITAL_COMMUNITY)
Admission: RE | Admit: 2020-01-17 | Discharge: 2020-01-18 | Disposition: A | Discharge status: Home / Self Care | Payer: BC Managed Care – PPO | Source: Ambulatory Visit | Attending: Otolaryngology | Admitting: Otolaryngology

## 2020-01-17 DIAGNOSIS — Z9889 Other specified postprocedural states: Secondary | ICD-10-CM

## 2020-01-17 DIAGNOSIS — Z8616 Personal history of COVID-19: Secondary | ICD-10-CM | POA: Diagnosis not present

## 2020-01-17 DIAGNOSIS — E890 Postprocedural hypothyroidism: Secondary | ICD-10-CM

## 2020-01-17 DIAGNOSIS — Z9089 Acquired absence of other organs: Secondary | ICD-10-CM

## 2020-01-17 DIAGNOSIS — C73 Malignant neoplasm of thyroid gland: Principal | ICD-10-CM | POA: Insufficient documentation

## 2020-01-17 DIAGNOSIS — G43709 Chronic migraine without aura, not intractable, without status migrainosus: Secondary | ICD-10-CM | POA: Diagnosis not present

## 2020-01-17 DIAGNOSIS — R05 Cough: Secondary | ICD-10-CM | POA: Diagnosis not present

## 2020-01-17 HISTORY — PX: THYROIDECTOMY: SHX17

## 2020-01-17 HISTORY — DX: Other complications of anesthesia, initial encounter: T88.59XA

## 2020-01-17 LAB — GLUCOSE, CAPILLARY: Glucose-Capillary: 163 mg/dL — ABNORMAL HIGH (ref 70–99)

## 2020-01-17 LAB — CALCIUM
Calcium: 8.2 mg/dL — ABNORMAL LOW (ref 8.9–10.3)
Calcium: 7.8 mg/dL — ABNORMAL LOW (ref 8.9–10.3)

## 2020-01-17 LAB — POCT PREGNANCY, URINE: Preg Test, Ur: NEGATIVE

## 2020-01-17 SURGERY — THYROIDECTOMY
Anesthesia: General | Site: Neck | Laterality: Bilateral

## 2020-01-17 MED ORDER — DEXTROSE-NACL 5-0.9 % IV SOLN: INTRAVENOUS | Status: DC

## 2020-01-17 MED ORDER — ORAL CARE MOUTH RINSE: 15.0000 mL | Freq: Once | OROMUCOSAL | Status: AC

## 2020-01-17 MED ORDER — 0.9 % SODIUM CHLORIDE (POUR BTL) OPTIME
TOPICAL | Status: DC | PRN
  Administered 2020-01-17: 1000 mL

## 2020-01-17 MED ORDER — DEXAMETHASONE SODIUM PHOSPHATE 10 MG/ML IJ SOLN
INTRAMUSCULAR | Status: AC
  Filled 2020-01-17: qty 1

## 2020-01-17 MED ORDER — HYDROCODONE-ACETAMINOPHEN 5-325 MG PO TABS
1.0000 | ORAL_TABLET | ORAL | Status: DC | PRN
  Administered 2020-01-18: 1 via ORAL
  Filled 2020-01-17 (×2): qty 1

## 2020-01-17 MED ORDER — LORATADINE 10 MG PO TABS
10.0000 mg | ORAL_TABLET | Freq: Every day | ORAL | Status: DC
  Administered 2020-01-18: 10 mg via ORAL
  Filled 2020-01-17: qty 1

## 2020-01-17 MED ORDER — PROMETHAZINE HCL 25 MG PO TABS: 25.0000 mg | ORAL_TABLET | Freq: Four times a day (QID) | ORAL | Status: DC | PRN

## 2020-01-17 MED ORDER — LIDOCAINE-EPINEPHRINE 1 %-1:100000 IJ SOLN
INTRAMUSCULAR | Status: AC
  Filled 2020-01-17: qty 1

## 2020-01-17 MED ORDER — LIDOCAINE 20MG/ML (2%) 15 ML SYRINGE OPTIME
INTRAMUSCULAR | Status: DC | PRN
  Administered 2020-01-17: 60 mg via INTRAVENOUS

## 2020-01-17 MED ORDER — OXYCODONE HCL 5 MG PO TABS: 5.0000 mg | ORAL_TABLET | Freq: Once | ORAL | Status: DC | PRN

## 2020-01-17 MED ORDER — SUCCINYLCHOLINE CHLORIDE 200 MG/10ML IV SOSY
PREFILLED_SYRINGE | INTRAVENOUS | Status: AC
  Filled 2020-01-17: qty 10

## 2020-01-17 MED ORDER — PROMETHAZINE HCL 25 MG RE SUPP
25.0000 mg | Freq: Four times a day (QID) | RECTAL | Status: DC | PRN
  Filled 2020-01-17: qty 1

## 2020-01-17 MED ORDER — CHLORHEXIDINE GLUCONATE 0.12 % MT SOLN
15.0000 mL | Freq: Once | OROMUCOSAL | Status: AC
  Administered 2020-01-17: 15 mL via OROMUCOSAL
  Filled 2020-01-17: qty 15

## 2020-01-17 MED ORDER — FENTANYL CITRATE (PF) 100 MCG/2ML IJ SOLN
INTRAMUSCULAR | Status: DC | PRN
  Administered 2020-01-17 (×5): 50 ug via INTRAVENOUS

## 2020-01-17 MED ORDER — ONDANSETRON HCL 4 MG/2ML IJ SOLN
INTRAMUSCULAR | Status: DC | PRN
  Administered 2020-01-17: 4 mg via INTRAVENOUS

## 2020-01-17 MED ORDER — EPHEDRINE 5 MG/ML INJ
INTRAVENOUS | Status: AC
  Filled 2020-01-17: qty 10

## 2020-01-17 MED ORDER — FENTANYL CITRATE (PF) 250 MCG/5ML IJ SOLN
INTRAMUSCULAR | Status: AC
  Filled 2020-01-17: qty 5

## 2020-01-17 MED ORDER — HYDROMORPHONE HCL 1 MG/ML IJ SOLN
INTRAMUSCULAR | Status: AC
  Filled 2020-01-17: qty 1

## 2020-01-17 MED ORDER — ONDANSETRON HCL 4 MG/2ML IJ SOLN: 4.0000 mg | Freq: Once | INTRAMUSCULAR | Status: DC | PRN

## 2020-01-17 MED ORDER — FENTANYL CITRATE (PF) 100 MCG/2ML IJ SOLN
25.0000 ug | INTRAMUSCULAR | Status: DC | PRN
  Administered 2020-01-17 (×2): 25 ug via INTRAVENOUS

## 2020-01-17 MED ORDER — HYDROMORPHONE HCL 1 MG/ML IJ SOLN
0.2500 mg | INTRAMUSCULAR | Status: DC | PRN
  Administered 2020-01-17 (×2): 0.5 mg via INTRAVENOUS

## 2020-01-17 MED ORDER — PHENYLEPHRINE 40 MCG/ML (10ML) SYRINGE FOR IV PUSH (FOR BLOOD PRESSURE SUPPORT)
PREFILLED_SYRINGE | INTRAVENOUS | Status: AC
  Filled 2020-01-17: qty 10

## 2020-01-17 MED ORDER — FENTANYL CITRATE (PF) 100 MCG/2ML IJ SOLN
INTRAMUSCULAR | Status: AC
  Administered 2020-01-17: 50 ug via INTRAVENOUS
  Filled 2020-01-17: qty 2

## 2020-01-17 MED ORDER — IBUPROFEN 100 MG/5ML PO SUSP
400.0000 mg | Freq: Four times a day (QID) | ORAL | Status: DC | PRN
  Administered 2020-01-17 – 2020-01-18 (×4): 400 mg via ORAL
  Filled 2020-01-17 (×4): qty 20

## 2020-01-17 MED ORDER — LIDOCAINE 2% (20 MG/ML) 5 ML SYRINGE
INTRAMUSCULAR | Status: AC
  Filled 2020-01-17: qty 5

## 2020-01-17 MED ORDER — HYDROMORPHONE HCL 1 MG/ML IJ SOLN
INTRAMUSCULAR | Status: AC
  Administered 2020-01-17: 0.5 mg via INTRAVENOUS
  Filled 2020-01-17: qty 1

## 2020-01-17 MED ORDER — LEVONORGESTREL-ETHINYL ESTRAD 90-20 MCG PO TABS
1.0000 | ORAL_TABLET | Freq: Every day | ORAL | Status: DC
  Administered 2020-01-17: 1 via ORAL

## 2020-01-17 MED ORDER — MIDAZOLAM HCL 5 MG/5ML IJ SOLN
INTRAMUSCULAR | Status: DC | PRN
  Administered 2020-01-17: 2 mg via INTRAVENOUS

## 2020-01-17 MED ORDER — OXYCODONE HCL 5 MG/5ML PO SOLN: 5.0000 mg | Freq: Once | ORAL | Status: DC | PRN

## 2020-01-17 MED ORDER — HYDRALAZINE HCL 20 MG/ML IJ SOLN
INTRAMUSCULAR | Status: AC
  Filled 2020-01-17: qty 1

## 2020-01-17 MED ORDER — MIDAZOLAM HCL 2 MG/2ML IJ SOLN
INTRAMUSCULAR | Status: AC
  Filled 2020-01-17: qty 2

## 2020-01-17 MED ORDER — ROCURONIUM BROMIDE 10 MG/ML (PF) SYRINGE
PREFILLED_SYRINGE | INTRAVENOUS | Status: DC | PRN
  Administered 2020-01-17: 50 mg via INTRAVENOUS

## 2020-01-17 MED ORDER — MORPHINE SULFATE (PF) 2 MG/ML IV SOLN
2.0000 mg | INTRAVENOUS | Status: DC | PRN
  Administered 2020-01-17: 2 mg via INTRAVENOUS
  Filled 2020-01-17: qty 1

## 2020-01-17 MED ORDER — FLUTICASONE PROPIONATE 50 MCG/ACT NA SUSP: 1.0000 | Freq: Every day | NASAL | Status: DC | PRN

## 2020-01-17 MED ORDER — LACTATED RINGERS IV SOLN: INTRAVENOUS | Status: DC

## 2020-01-17 MED ORDER — PROPOFOL 10 MG/ML IV BOLUS
INTRAVENOUS | Status: AC
  Filled 2020-01-17: qty 40

## 2020-01-17 MED ORDER — SUGAMMADEX SODIUM 200 MG/2ML IV SOLN
INTRAVENOUS | Status: DC | PRN
  Administered 2020-01-17: 127 mg via INTRAVENOUS

## 2020-01-17 MED ORDER — ROCURONIUM BROMIDE 10 MG/ML (PF) SYRINGE
PREFILLED_SYRINGE | INTRAVENOUS | Status: AC
  Filled 2020-01-17: qty 10

## 2020-01-17 MED ORDER — ONDANSETRON HCL 4 MG/2ML IJ SOLN
INTRAMUSCULAR | Status: AC
  Filled 2020-01-17: qty 2

## 2020-01-17 MED ORDER — LEVOTHYROXINE SODIUM 100 MCG PO TABS
100.0000 ug | ORAL_TABLET | Freq: Every day | ORAL | Status: DC
  Administered 2020-01-18: 100 ug via ORAL
  Filled 2020-01-17: qty 1

## 2020-01-17 MED ORDER — PROPOFOL 10 MG/ML IV BOLUS
INTRAVENOUS | Status: DC | PRN
  Administered 2020-01-17: 200 mg via INTRAVENOUS

## 2020-01-17 MED ORDER — HYDRALAZINE HCL 20 MG/ML IJ SOLN
10.0000 mg | Freq: Once | INTRAMUSCULAR | Status: AC
  Administered 2020-01-17: 10 mg via INTRAVENOUS

## 2020-01-17 MED ORDER — DEXAMETHASONE SODIUM PHOSPHATE 10 MG/ML IJ SOLN
INTRAMUSCULAR | Status: DC | PRN
  Administered 2020-01-17: 4 mg via INTRAVENOUS

## 2020-01-17 SURGICAL SUPPLY — 40 items
ADH SKN CLS APL DERMABOND .7 (GAUZE/BANDAGES/DRESSINGS) ×1
BLADE SURG 15 STRL LF DISP TIS (BLADE) IMPLANT
BLADE SURG 15 STRL SS (BLADE)
CANISTER SUCT 3000ML PPV (MISCELLANEOUS) ×2 IMPLANT
CLEANER TIP ELECTROSURG 2X2 (MISCELLANEOUS) ×2 IMPLANT
CNTNR URN SCR LID CUP LEK RST (MISCELLANEOUS) ×1 IMPLANT
CONT SPEC 4OZ STRL OR WHT (MISCELLANEOUS) ×2
CORD BIPOLAR FORCEPS 12FT (ELECTRODE) ×2 IMPLANT
COVER SURGICAL LIGHT HANDLE (MISCELLANEOUS) ×2 IMPLANT
COVER WAND RF STERILE (DRAPES) ×2 IMPLANT
DERMABOND ADVANCED (GAUZE/BANDAGES/DRESSINGS) ×1
DERMABOND ADVANCED .7 DNX12 (GAUZE/BANDAGES/DRESSINGS) ×1 IMPLANT
DRAIN HEMOVAC 7FR (DRAIN) IMPLANT
DRAIN SNY 10 ROU (WOUND CARE) ×2 IMPLANT
DRAPE HALF SHEET 40X57 (DRAPES) ×2 IMPLANT
ELECT COATED BLADE 2.86 ST (ELECTRODE) ×2 IMPLANT
ELECT REM PT RETURN 9FT ADLT (ELECTROSURGICAL) ×2
ELECTRODE REM PT RTRN 9FT ADLT (ELECTROSURGICAL) ×1 IMPLANT
EVACUATOR SILICONE 100CC (DRAIN) ×2 IMPLANT
FORCEPS BIPOLAR SPETZLER 8 1.0 (NEUROSURGERY SUPPLIES) ×2 IMPLANT
GAUZE 4X4 16PLY RFD (DISPOSABLE) ×2 IMPLANT
GLOVE BIO SURGEON STRL SZ 6.5 (GLOVE) ×2 IMPLANT
GLOVE ECLIPSE 7.5 STRL STRAW (GLOVE) ×2 IMPLANT
GOWN STRL REUS W/ TWL LRG LVL3 (GOWN DISPOSABLE) ×2 IMPLANT
GOWN STRL REUS W/TWL LRG LVL3 (GOWN DISPOSABLE) ×4
KIT BASIN OR (CUSTOM PROCEDURE TRAY) ×2 IMPLANT
KIT TURNOVER KIT B (KITS) ×2 IMPLANT
NEEDLE PRECISIONGLIDE 27X1.5 (NEEDLE) ×2 IMPLANT
NS IRRIG 1000ML POUR BTL (IV SOLUTION) ×2 IMPLANT
PAD ARMBOARD 7.5X6 YLW CONV (MISCELLANEOUS) ×4 IMPLANT
PENCIL FOOT CONTROL (ELECTRODE) ×2 IMPLANT
SHEARS HARMONIC 9CM CVD (BLADE) ×2 IMPLANT
STAPLER VISISTAT 35W (STAPLE) ×2 IMPLANT
SUT CHROMIC 3 0 PS 2 (SUTURE) ×2 IMPLANT
SUT CHROMIC 4 0 PS 2 18 (SUTURE) IMPLANT
SUT ETHILON 3 0 PS 1 (SUTURE) ×2 IMPLANT
SUT SILK 3 0 REEL (SUTURE) ×2 IMPLANT
SUT SILK 4 0 REEL (SUTURE) ×2 IMPLANT
TOWEL GREEN STERILE FF (TOWEL DISPOSABLE) ×2 IMPLANT
TRAY ENT MC OR (CUSTOM PROCEDURE TRAY) ×2 IMPLANT

## 2020-01-17 NOTE — Interval H&P Note (Signed)
History and Physical Interval Note:  01/17/2020 8:14 AM  Melinda Jones  has presented today for surgery, with the diagnosis of Thyroid cancer.  The various methods of treatment have been discussed with the patient and family. After consideration of risks, benefits and other options for treatment, the patient has consented to  Procedure(s): THYROIDECTOMY/TOTAL (Bilateral) as a surgical intervention.  The patient's history has been reviewed, patient examined, no change in status, stable for surgery.  I have reviewed the patient's chart and labs.  Questions were answered to the patient's satisfaction.     Izora Gala

## 2020-01-17 NOTE — Op Note (Signed)
OPERATIVE REPORT  DATE OF SURGERY: 01/17/2020  PATIENT:  Melinda Jones,  34 y.o. female  PRE-OPERATIVE DIAGNOSIS:  Thyroid cancer  POST-OPERATIVE DIAGNOSIS:  Thyroid cancer  PROCEDURE:  Procedure(s): THYROIDECTOMY/TOTAL  SURGEON:  Beckie Salts, MD  ASSISTANTS: Jolene Provost PA  ANESTHESIA:   General   EBL: 30 ml  DRAINS: 10 French round JP  LOCAL MEDICATIONS USED:  None  SPECIMEN: Total thyroidectomy, suture marks right lobe  COUNTS:  Correct  PROCEDURE DETAILS: The patient was taken to the operating room and placed on the operating table in the supine position. A shoulder roll was placed beneath the shoulder blades and the neck was extended. The neck was prepped and draped in a standard fashion. A low collar transverse incision was outlined with a marking pen and was incised with electrocautery. Dissection was continued down through the platysma layer.  Self-retaining retractors were used throughout the case.  The midline fascia was divided.  The thyroid isthmus was exposed.  The strap muscles were reflected laterally.  The right side was dissected first.  The superior pole was brought down and the vasculature was identified divided between clamps using the harmonic dissector.  As the superior pole was brought inferiorly and medially the recurrent nerve was identified and all branches were preserved.  The middle thyroid vein and lower vasculature were also divided using the harmonic dissector.  There is ligament was dissected off the trachea as the specimen was brought forward.  Similar dissection was performed on the left.  A thin pyramidal lobe was dissected inferiorly and kept with the specimen.  On the left side a putative superior and inferior parathyroid were identified and preserved with a blood supply.  The recurrent nerve was identified and preserved as well.  There is a firm nodule within the midportion of the right lobe.  No other pathologic findings were noted.   Specimen was removed and sent for pathologic evaluation.  Pretracheal region was palpated and inspected there were no lymph nodes identified.  The wound was irrigated with saline.  Drain was inserted through a separate stab incision inferiorly.    The drain was secured in place with a nylon suture. The midline fascia was reapproximated with interrupted chromic suture. The platysma layer was also reapproximated with interrupted chromic suture. A running subcuticular closure was accomplished. Dermabond was used on the skin. The drain was charged. The patient was awakened, extubated and transferred to recovery in stable condition.   PATIENT DISPOSITION:  To PACU, stable

## 2020-01-17 NOTE — Transfer of Care (Signed)
Immediate Anesthesia Transfer of Care Note  Patient: Melinda Jones  Procedure(s) Performed: Gilda Crease (Bilateral Neck)  Patient Location: PACU  Anesthesia Type:General  Level of Consciousness: awake and patient cooperative  Airway & Oxygen Therapy: Patient Spontanous Breathing  Post-op Assessment: Report given to RN, Post -op Vital signs reviewed and stable and Patient moving all extremities X 4  Post vital signs: Reviewed and stable  Last Vitals:  Vitals Value Taken Time  BP    Temp    Pulse    Resp    SpO2      Last Pain:  Vitals:   01/17/20 0757  TempSrc:   PainSc: 0-No pain      Patients Stated Pain Goal: 3 (02/63/78 5885)  Complications: No complications documented.

## 2020-01-17 NOTE — Progress Notes (Signed)
   ENT Progress Note: s/p Procedure(s): THYROIDECTOMY/TOTAL   Subjective: C/O Mod neck pain  Objective: Vital signs in last 24 hours: Temp:  [97.4 F (36.3 C)-99 F (37.2 C)] 97.4 F (36.3 C) (08/02 1344) Pulse Rate:  [58-89] 65 (08/02 1344) Resp:  [12-20] 14 (08/02 1344) BP: (124-166)/(74-121) 129/82 (08/02 1344) SpO2:  [97 %-100 %] 100 % (08/02 1344) Weight:  [63.5 kg] 63.5 kg (08/02 0710) Weight change:     Intake/Output from previous day: No intake/output data recorded. Intake/Output this shift: Total I/O In: 850 [I.V.:850] Out: 30 [Blood:30]  Labs: No results for input(s): WBC, HGB, HCT, PLT in the last 72 hours. Recent Labs    01/17/20 1436  CALCIUM 8.2*    Studies/Results: No results found.   PHYSICAL EXAM: Good voice No swelling, JP inplace and functional   Assessment/Plan: Pt stable Monitor CA levels PO as tolerated Cont pain meds    Jerrell Belfast 01/17/2020, 5:17 PM

## 2020-01-17 NOTE — Anesthesia Preprocedure Evaluation (Signed)
Anesthesia Evaluation  Patient identified by MRN, date of birth, ID band Patient awake    Reviewed: Allergy & Precautions, NPO status , Patient's Chart, lab work & pertinent test results  History of Anesthesia Complications Negative for: history of anesthetic complications  Airway Mallampati: III  TM Distance: >3 FB Neck ROM: Full    Dental  (+) Teeth Intact   Pulmonary neg pulmonary ROS,    Pulmonary exam normal        Cardiovascular negative cardio ROS Normal cardiovascular exam     Neuro/Psych  Headaches, negative psych ROS   GI/Hepatic negative GI ROS, Neg liver ROS,   Endo/Other  negative endocrine ROS  Renal/GU negative Renal ROS  negative genitourinary   Musculoskeletal negative musculoskeletal ROS (+)   Abdominal   Peds  Hematology negative hematology ROS (+)   Anesthesia Other Findings  Papillary thyroid cancer  Reproductive/Obstetrics negative OB ROS                             Anesthesia Physical Anesthesia Plan  ASA: II  Anesthesia Plan: General   Post-op Pain Management:    Induction: Intravenous  PONV Risk Score and Plan: 3 and Ondansetron, Dexamethasone, Treatment may vary due to age or medical condition and Midazolam  Airway Management Planned: Oral ETT  Additional Equipment: None  Intra-op Plan:   Post-operative Plan: Extubation in OR  Informed Consent: I have reviewed the patients History and Physical, chart, labs and discussed the procedure including the risks, benefits and alternatives for the proposed anesthesia with the patient or authorized representative who has indicated his/her understanding and acceptance.     Dental advisory given  Plan Discussed with:   Anesthesia Plan Comments:         Anesthesia Quick Evaluation

## 2020-01-17 NOTE — Anesthesia Postprocedure Evaluation (Signed)
Anesthesia Post Note  Patient: Melinda Jones  Procedure(s) Performed: THYROIDECTOMY/TOTAL (Bilateral Neck)     Patient location during evaluation: PACU Anesthesia Type: General Level of consciousness: awake and alert Pain management: pain level controlled Vital Signs Assessment: post-procedure vital signs reviewed and stable Respiratory status: spontaneous breathing, nonlabored ventilation and respiratory function stable Cardiovascular status: blood pressure returned to baseline and stable Postop Assessment: no apparent nausea or vomiting Anesthetic complications: no   No complications documented.  Last Vitals:  Vitals:   01/17/20 1320 01/17/20 1325  BP:    Pulse: 81 73  Resp: 12 13  Temp:    SpO2: 99% 99%    Last Pain:  Vitals:   01/17/20 1325  TempSrc:   PainSc: 4     LLE Motor Response: Purposeful movement (01/17/20 1325)   RLE Motor Response: Purposeful movement (01/17/20 1325)        Lidia Collum

## 2020-01-17 NOTE — Anesthesia Procedure Notes (Signed)
Procedure Name: Intubation Date/Time: 01/17/2020 8:52 AM Performed by: Lowella Dell, CRNA Pre-anesthesia Checklist: Patient identified, Emergency Drugs available, Suction available and Patient being monitored Patient Re-evaluated:Patient Re-evaluated prior to induction Oxygen Delivery Method: Circle System Utilized Preoxygenation: Pre-oxygenation with 100% oxygen Induction Type: IV induction Ventilation: Mask ventilation without difficulty Laryngoscope Size: Mac and 3 Grade View: Grade I Tube type: Oral Number of attempts: 1 Airway Equipment and Method: Stylet Placement Confirmation: ETT inserted through vocal cords under direct vision,  positive ETCO2 and breath sounds checked- equal and bilateral Secured at: 22 cm Tube secured with: Tape Dental Injury: Teeth and Oropharynx as per pre-operative assessment

## 2020-01-17 NOTE — Progress Notes (Signed)
Melinda Jones is a 34 y.o. female patient admitted. Awake, alert - oriented  X 4 - no acute distress noted.  VSS - Blood pressure 119/82, pulse 71, temperature 97.6 F (36.4 C), temperature source Oral, resp. rate 17, height 5\' 9"  (1.753 m), weight 63.5 kg, last menstrual period 10/06/2018, SpO2 100 %, unknown if currently breastfeeding.    IV in place, occlusive dsg intact without redness. Surgical site to neck is secured with dermabond. No drainage noted JP drain intact empty at this time. Orientation to room, and floor completed.  Admission INP armband ID verified with patient/family, and in place.   SR up x 2, fall assessment complete, with patient and family able to verbalize understanding of risk associated with falls, and verbalized understanding to call nsg before up out of bed.  Call light within reach, patient able to voice, and demonstrate understanding. No evidence of skin break down noted on exam.  Admission nurse notified of admission.     Will cont to eval and treat per MD orders.  Hezzie Bump, RN 01/17/2020 8:36 PM

## 2020-01-18 ENCOUNTER — Encounter (HOSPITAL_COMMUNITY): Payer: Self-pay | Admitting: Otolaryngology

## 2020-01-18 LAB — CALCIUM: Calcium: 7.3 mg/dL — ABNORMAL LOW (ref 8.9–10.3)

## 2020-01-18 MED ORDER — PROMETHAZINE HCL 25 MG RE SUPP
25.0000 mg | Freq: Four times a day (QID) | RECTAL | 1 refills | Status: AC | PRN
Start: 2020-01-18 — End: ?

## 2020-01-18 MED ORDER — HYDROCODONE-ACETAMINOPHEN 7.5-325 MG PO TABS: 1.0000 | ORAL_TABLET | Freq: Four times a day (QID) | ORAL | 0 refills | Status: DC | PRN

## 2020-01-18 MED ORDER — LEVOTHYROXINE SODIUM 100 MCG PO TABS: 100.0000 ug | ORAL_TABLET | Freq: Every day | ORAL | 6 refills | Status: AC

## 2020-01-18 NOTE — Discharge Summary (Signed)
Physician Discharge Summary  Patient ID: Melinda Jones MRN: [id number] DOB/AGE: [birth year] 34 y.o.  Admit date: 01/17/2020 Discharge date: 01/18/2020  Admission Diagnoses: Papillary thyroid cancer  Discharge Diagnoses:  Active Problems:   S/P thyroidectomy   Discharged Condition: good  Hospital Course: No complications.  Calcium levels dropped postoperatively but not precipitously.  She was asymptomatic at the time of discharge.  We will test again in 1 to 2 days.  Consults: none  Significant Diagnostic Studies: none  Treatments: surgery: Total thyroidectomy  Discharge Exam: Blood pressure 104/73, pulse 73, temperature 98.4 F (36.9 C), temperature source Oral, resp. rate 16, height 5\' 9"  (1.753 m), weight 63.5 kg, last menstrual period 10/06/2018, SpO2 99 %, unknown if currently breastfeeding. PHYSICAL EXAM: Awake and alert.  Voice sounds excellent.  Surgical site looks perfect.  Drain removed.  [place] sign negative.  Disposition: Discharge disposition: 01-Home or Self Care       Discharge Instructions    Diet - low sodium heart healthy   Complete by: As directed    Increase activity slowly   Complete by: As directed    No wound care   Complete by: As directed      Allergies as of 01/18/2020      Reactions   Amoxicillin-pot Clavulanate Hives   Cefaclor Hives   Iodine Hives   "internal"      Medication List    TAKE these medications   acetaminophen 325 MG tablet Commonly known as: TYLENOL Take 650 mg by mouth every 6 (six) hours as needed for moderate pain.   Amethyst 90-20 MCG tablet Generic drug: levonorgestrel-ethinyl estradiol Take 1 tablet by mouth daily.   fluticasone 50 MCG/ACT nasal spray Commonly known as: FLONASE Place 1 spray into both nostrils daily as needed for allergies or rhinitis.   HYDROcodone-acetaminophen 7.5-325 MG tablet Commonly known as: Norco Take 1 tablet by mouth every 6 (six) hours as needed for moderate pain.    levothyroxine 100 MCG tablet Commonly known as: Synthroid Take 1 tablet (100 mcg total) by mouth daily.   loratadine 10 MG tablet Commonly known as: CLARITIN Take 10 mg by mouth daily.   predniSONE 50 MG tablet Commonly known as: DELTASONE Take 1 pill 13 hours, 7 hours  and 1 hour before MRI for a total of 3 doses of prednisone. Take 50mg  of Benadryl with the prednisone 1 hour before MRI for a total of one dose of benadryl.   promethazine 25 MG suppository Commonly known as: PHENERGAN Place 1 suppository (25 mg total) rectally every 6 (six) hours as needed for nausea or vomiting.   rizatriptan 10 MG disintegrating tablet Commonly known as: MAXALT-MLT Take 1 tablet (10 mg total) by mouth as needed for migraine. May repeat in 2 hours if needed   SAM-E PO Take by mouth.        Signed: Izora Gala 01/18/2020, 8:51 AM

## 2020-01-18 NOTE — Discharge Instructions (Signed)
You may shower and use soap and water. Do not use any creams, oils or ointment.  Okay to use Tylenol and/or Motrin for pain.  Use the prescription pain medicine if he needs something stronger.  Motrin can be taken as follows: 2 tablets every 4 hours, 3 tablets every 6 hours.  If you develop any numbness or tingling around her lips or fingertips contact our office immediately.  If you develop any muscle cramps or twitching also contact our office.  If you need to you may start taking Caltrate 600+ D which you can purchase over-the-counter.

## 2020-01-19 ENCOUNTER — Other Ambulatory Visit: Payer: Self-pay

## 2020-01-19 ENCOUNTER — Encounter (HOSPITAL_COMMUNITY): Payer: Self-pay | Admitting: Emergency Medicine

## 2020-01-19 ENCOUNTER — Emergency Department (HOSPITAL_COMMUNITY)
Admission: EM | Admit: 2020-01-19 | Discharge: 2020-01-20 | Disposition: A | Discharge status: Home / Self Care | Payer: BC Managed Care – PPO | Attending: Emergency Medicine | Admitting: Emergency Medicine

## 2020-01-19 DIAGNOSIS — Z5321 Procedure and treatment not carried out due to patient leaving prior to being seen by health care provider: Secondary | ICD-10-CM | POA: Diagnosis not present

## 2020-01-19 DIAGNOSIS — R200 Anesthesia of skin: Secondary | ICD-10-CM | POA: Insufficient documentation

## 2020-01-19 DIAGNOSIS — M62838 Other muscle spasm: Secondary | ICD-10-CM | POA: Diagnosis not present

## 2020-01-19 LAB — SURGICAL PATHOLOGY

## 2020-01-19 LAB — BASIC METABOLIC PANEL
Anion gap: 13 (ref 5–15)
BUN: 8 mg/dL (ref 6–20)
CO2: 20 mmol/L — ABNORMAL LOW (ref 22–32)
Calcium: 7.1 mg/dL — ABNORMAL LOW (ref 8.9–10.3)
Chloride: 97 mmol/L — ABNORMAL LOW (ref 98–111)
Creatinine, Ser: 0.59 mg/dL (ref 0.44–1.00)
GFR calc Af Amer: >60 mL/min (ref >60)
GFR calc non Af Amer: >60 mL/min (ref >60)
Glucose, Bld: 160 mg/dL — ABNORMAL HIGH (ref 70–99)
Potassium: 3 mmol/L — ABNORMAL LOW (ref 3.5–5.1)
Sodium: 130 mmol/L — ABNORMAL LOW (ref 135–145)

## 2020-01-19 LAB — CBC
HCT: 41.6 % (ref 36.0–46.0)
Hemoglobin: 13.5 g/dL (ref 12.0–15.0)
MCH: 29.8 pg (ref 26.0–34.0)
MCHC: 32.5 g/dL (ref 30.0–36.0)
MCV: 91.8 fL (ref 80.0–100.0)
Platelets: 308 10*3/uL (ref 150–400)
RBC: 4.53 MIL/uL (ref 3.87–5.11)
RDW: 12.2 % (ref 11.5–15.5)
WBC: 8.8 10*3/uL (ref 4.0–10.5)
nRBC: 0 % (ref 0.0–0.2)

## 2020-01-19 NOTE — ED Notes (Signed)
Pt left due to wait time  

## 2020-01-19 NOTE — ED Triage Notes (Signed)
Pt presents to ED POV. Pt c/o tingling in fingers, muscle spasms in arms, legs, and torso. Pt reports that she had a thyroidectomy on mon. Pt states that she was told to come back if she she experienced these symptoms

## 2020-02-03 ENCOUNTER — Ambulatory Visit: Payer: BC Managed Care – PPO | Admitting: Neurology

## 2020-02-14 DIAGNOSIS — C73 Malignant neoplasm of thyroid gland: Secondary | ICD-10-CM | POA: Diagnosis not present

## 2020-03-16 DIAGNOSIS — C73 Malignant neoplasm of thyroid gland: Secondary | ICD-10-CM | POA: Diagnosis not present

## 2020-03-16 DIAGNOSIS — E890 Postprocedural hypothyroidism: Secondary | ICD-10-CM | POA: Diagnosis not present

## 2020-03-20 ENCOUNTER — Encounter (HOSPITAL_BASED_OUTPATIENT_CLINIC_OR_DEPARTMENT_OTHER): Payer: Self-pay | Admitting: Otolaryngology

## 2020-03-20 ENCOUNTER — Other Ambulatory Visit: Payer: Self-pay

## 2020-03-22 ENCOUNTER — Other Ambulatory Visit (HOSPITAL_COMMUNITY): Payer: Self-pay | Admitting: Endocrinology

## 2020-03-22 DIAGNOSIS — C73 Malignant neoplasm of thyroid gland: Secondary | ICD-10-CM

## 2020-03-23 ENCOUNTER — Other Ambulatory Visit (HOSPITAL_COMMUNITY): Payer: BC Managed Care – PPO

## 2020-04-03 ENCOUNTER — Encounter (HOSPITAL_COMMUNITY)
Admission: RE | Admit: 2020-04-03 | Discharge: 2020-04-03 | Disposition: A | Discharge status: Home / Self Care | Payer: BC Managed Care – PPO | Source: Ambulatory Visit | Attending: Endocrinology | Admitting: Endocrinology

## 2020-04-03 ENCOUNTER — Other Ambulatory Visit: Payer: Self-pay

## 2020-04-03 DIAGNOSIS — C73 Malignant neoplasm of thyroid gland: Secondary | ICD-10-CM | POA: Diagnosis not present

## 2020-04-03 MED ORDER — STERILE WATER FOR INJECTION IJ SOLN
INTRAMUSCULAR | Status: AC
  Administered 2020-04-03: 1.2 mL via INTRAMUSCULAR
  Filled 2020-04-03: qty 10

## 2020-04-03 MED ORDER — THYROTROPIN ALFA 1.1 MG IM SOLR
0.9000 mg | INTRAMUSCULAR | Status: AC
  Administered 2020-04-03: 0.9 mg via INTRAMUSCULAR

## 2020-04-04 ENCOUNTER — Encounter (HOSPITAL_COMMUNITY)
Admission: RE | Admit: 2020-04-04 | Discharge: 2020-04-04 | Disposition: A | Discharge status: Home / Self Care | Payer: BC Managed Care – PPO | Source: Ambulatory Visit | Attending: Endocrinology | Admitting: Endocrinology

## 2020-04-04 DIAGNOSIS — C73 Malignant neoplasm of thyroid gland: Secondary | ICD-10-CM | POA: Diagnosis not present

## 2020-04-04 MED ORDER — THYROTROPIN ALFA 1.1 MG IM SOLR
0.9000 mg | INTRAMUSCULAR | Status: AC
  Administered 2020-04-04: 0.9 mg via INTRAMUSCULAR

## 2020-04-04 MED ORDER — STERILE WATER FOR INJECTION IJ SOLN
INTRAMUSCULAR | Status: AC
  Filled 2020-04-04: qty 10

## 2020-04-05 ENCOUNTER — Other Ambulatory Visit: Payer: Self-pay

## 2020-04-05 ENCOUNTER — Encounter (HOSPITAL_COMMUNITY)
Admission: RE | Admit: 2020-04-05 | Discharge: 2020-04-05 | Disposition: A | Discharge status: Home / Self Care | Payer: BC Managed Care – PPO | Source: Ambulatory Visit | Attending: Endocrinology | Admitting: Endocrinology

## 2020-04-05 DIAGNOSIS — C73 Malignant neoplasm of thyroid gland: Secondary | ICD-10-CM | POA: Diagnosis not present

## 2020-04-05 LAB — HCG, SERUM, QUALITATIVE: Preg, Serum: NEGATIVE

## 2020-04-05 MED ORDER — SODIUM IODIDE I 131 CAPSULE
49.6000 | Freq: Once | INTRAVENOUS | Status: AC | PRN
  Administered 2020-04-05: 49.6 via ORAL

## 2020-04-10 ENCOUNTER — Ambulatory Visit: Payer: BC Managed Care – PPO | Admitting: Neurology

## 2020-04-12 ENCOUNTER — Encounter (HOSPITAL_COMMUNITY)
Admission: RE | Admit: 2020-04-12 | Discharge: 2020-04-12 | Disposition: A | Discharge status: Home / Self Care | Payer: BC Managed Care – PPO | Source: Ambulatory Visit | Attending: Endocrinology | Admitting: Endocrinology

## 2020-04-12 ENCOUNTER — Other Ambulatory Visit: Payer: Self-pay

## 2020-04-12 DIAGNOSIS — C73 Malignant neoplasm of thyroid gland: Secondary | ICD-10-CM | POA: Diagnosis not present

## 2020-04-14 ENCOUNTER — Other Ambulatory Visit (HOSPITAL_COMMUNITY): Payer: BC Managed Care – PPO

## 2020-04-19 DIAGNOSIS — E890 Postprocedural hypothyroidism: Secondary | ICD-10-CM | POA: Diagnosis not present

## 2020-04-19 DIAGNOSIS — C73 Malignant neoplasm of thyroid gland: Secondary | ICD-10-CM | POA: Diagnosis not present

## 2020-04-26 ENCOUNTER — Ambulatory Visit: Payer: BC Managed Care – PPO | Admitting: Neurology

## 2020-04-27 NOTE — H&P (Signed)
HPI:   Melinda Jones is a 34 y.o. female who presents as a new Patient.   Referring Provider: Self, A Referral  Chief complaint: Cough.  HPI: She moved here from [place] this past year. She had Covid about a month ago. She has had a cough ever since. She has a history of chronic allergies. She has been allergy tested twice in [place]. She is allergic to multiple things. She has recurrent sinus infections. According to her mother her right tonsil has been larger than the left for her entire life. When she has a sinus infection she usually has left-sided periorbital and maxillary pain and pressure. She also has a lump on the posterior lower neck at the midline which has been present and slowly getting larger for several years.  PMH/Meds/All/SocHx/FamHx/ROS:   Past Medical History:  Diagnosis Date  . Endometriosis  . History of COVID-19   Past Surgical History:  Procedure Laterality Date  . LAPAROSCOPY  . WISDOM TOOTH EXTRACTION   No family history of bleeding disorders, wound healing problems or difficulty with anesthesia.   Social History   Socioeconomic History  . Marital status: Unknown  Spouse name: Not on file  . Number of children: Not on file  . Years of education: Not on file  . Highest education level: Not on file  Occupational History  . Not on file  Tobacco Use  . Smoking status: Never Smoker  . Smokeless tobacco: Never Used  Substance and Sexual Activity  . Alcohol use: Not on file  . Drug use: Not on file  . Sexual activity: Not on file  Other Topics Concern  . Not on file  Social History Narrative  . Not on file   Social Determinants of Health   Financial Resource Strain:  . Difficulty of Paying Living Expenses:  Food Insecurity:  . Worried About Charity fundraiser in the Last Year:  . Arboriculturist in the Last Year:  Transportation Needs:  . Film/video editor (Medical):  Marland Kitchen Lack of Transportation (Non-Medical):  Physical Activity:  . Days of  Exercise per Week:  . Minutes of Exercise per Session:  Stress:  . Feeling of Stress :  Social Connections:  . Frequency of Communication with Friends and Family:  . Frequency of Social Gatherings with Friends and Family:  . Attends Religious Services:  . Active Member of Clubs or Organizations:  . Attends Archivist Meetings:  Marland Kitchen Marital Status:   Current Outpatient Medications:  . AMETHYST, 28, 90-20 mcg (28) per tablet, , Disp: , Rfl:  . fluticasone propionate (FLOVENT HFA) 110 mcg/actuation inhaler, Inhale 110 mcg into the lungs 2 times daily., Disp: , Rfl:  . loratadine (CLARITIN) 10 mg tablet, Take 10 mg by mouth daily., Disp: , Rfl:   A complete ROS was performed with pertinent positives/negatives noted in the HPI. The remainder of the ROS are negative.   Physical Exam:   BP 160/96  Pulse 67  Temp 97.1 F (36.2 C)  Ht 1.753 m (5\' 9" )  Wt 64 kg (141 lb)  BMI 20.82 kg/m   General: Healthy and alert, in no distress, breathing easily. Normal affect. In a pleasant mood. Head: Normocephalic, atraumatic. No masses, or scars. Eyes: Pupils are equal, and reactive to light. Vision is grossly intact. No spontaneous or gaze nystagmus. Ears: Ear canals are clear. Tympanic membranes are intact, with normal landmarks and the middle ears are clear and healthy. Hearing: Grossly normal. Nose: Nasal cavities  reveal mucosal edema worse on the right with some mucoid secretions with some slight discoloration. Face: No masses or scars, facial nerve function is symmetric. Oral Cavity: No mucosal abnormalities are noted. Tongue with normal mobility. Dentition appears healthy. Oropharynx: Tonsils are symmetric. There are no mucosal masses identified. Tongue base appears normal and healthy. Larynx/Hypopharynx: indirect exam reveals healthy, mobile vocal cords, without mucosal lesions in the hypopharynx or larynx. Chest: Deferred Neck: 6-7 cm lipomatous mass at the base of the neck  posteriorly at the midline, no cervical adenopathy, no thyroid nodules or enlargement. Neuro: Cranial nerves II-XII with normal function. Balance: Normal gate. Other findings: none.  Independent Review of Additional Tests or Records:  none  Procedures:  none  Impression & Plans:  1 month history of chronic cough since having Covid. She was last on antibiotics for her sinuses in February. I think she may have an acute sinusitis right now. Recommend clindamycin for 10 days.We discussed the importance of eating active or live culture yoghurt 2-3 times daily while taking the antibiotics. This can help avoid GI side effects. Follow-up in 3 weeks for recheck at which point we may want to do some sinus CT scans.  Chronic allergies. Recommend we refer her to a local allergist for continued care.  Posterior neck mass likely lipoma. She can have this removed if she desires.

## 2020-04-28 ENCOUNTER — Encounter (HOSPITAL_BASED_OUTPATIENT_CLINIC_OR_DEPARTMENT_OTHER): Payer: Self-pay | Admitting: Otolaryngology

## 2020-04-28 ENCOUNTER — Other Ambulatory Visit: Payer: Self-pay

## 2020-05-04 ENCOUNTER — Other Ambulatory Visit (HOSPITAL_COMMUNITY)
Admission: RE | Admit: 2020-05-04 | Discharge: 2020-05-04 | Disposition: A | Discharge status: Home / Self Care | Payer: BC Managed Care – PPO | Source: Ambulatory Visit | Attending: Otolaryngology | Admitting: Otolaryngology

## 2020-05-04 DIAGNOSIS — Z01812 Encounter for preprocedural laboratory examination: Secondary | ICD-10-CM | POA: Diagnosis not present

## 2020-05-04 DIAGNOSIS — Z20822 Contact with and (suspected) exposure to covid-19: Secondary | ICD-10-CM | POA: Diagnosis not present

## 2020-05-04 LAB — SARS CORONAVIRUS 2 (TAT 6-24 HRS): SARS Coronavirus 2: NEGATIVE

## 2020-05-08 ENCOUNTER — Ambulatory Visit (HOSPITAL_BASED_OUTPATIENT_CLINIC_OR_DEPARTMENT_OTHER): Payer: BC Managed Care – PPO | Admitting: Certified Registered"

## 2020-05-08 ENCOUNTER — Ambulatory Visit (HOSPITAL_BASED_OUTPATIENT_CLINIC_OR_DEPARTMENT_OTHER)
Admission: RE | Admit: 2020-05-08 | Discharge: 2020-05-08 | Disposition: A | Discharge status: Home / Self Care | Payer: BC Managed Care – PPO | Attending: Otolaryngology | Admitting: Otolaryngology

## 2020-05-08 ENCOUNTER — Encounter (HOSPITAL_BASED_OUTPATIENT_CLINIC_OR_DEPARTMENT_OTHER): Payer: Self-pay | Admitting: Otolaryngology

## 2020-05-08 ENCOUNTER — Other Ambulatory Visit: Payer: Self-pay

## 2020-05-08 ENCOUNTER — Encounter (HOSPITAL_BASED_OUTPATIENT_CLINIC_OR_DEPARTMENT_OTHER)
Admission: RE | Disposition: A | Discharge status: Home / Self Care | Payer: Self-pay | Source: Home / Self Care | Attending: Otolaryngology

## 2020-05-08 DIAGNOSIS — D170 Benign lipomatous neoplasm of skin and subcutaneous tissue of head, face and neck: Secondary | ICD-10-CM | POA: Insufficient documentation

## 2020-05-08 DIAGNOSIS — G43709 Chronic migraine without aura, not intractable, without status migrainosus: Secondary | ICD-10-CM | POA: Diagnosis not present

## 2020-05-08 DIAGNOSIS — Z7951 Long term (current) use of inhaled steroids: Secondary | ICD-10-CM | POA: Insufficient documentation

## 2020-05-08 DIAGNOSIS — Z8616 Personal history of COVID-19: Secondary | ICD-10-CM | POA: Diagnosis not present

## 2020-05-08 HISTORY — PX: EXCISION MASS NECK: SHX6703

## 2020-05-08 LAB — POCT PREGNANCY, URINE: Preg Test, Ur: NEGATIVE

## 2020-05-08 SURGERY — EXCISION, MASS, NECK
Anesthesia: General | Site: Neck

## 2020-05-08 MED ORDER — PROPOFOL 10 MG/ML IV BOLUS
INTRAVENOUS | Status: DC | PRN
  Administered 2020-05-08: 200 mg via INTRAVENOUS

## 2020-05-08 MED ORDER — LIDOCAINE-EPINEPHRINE 1 %-1:100000 IJ SOLN
INTRAMUSCULAR | Status: AC
  Filled 2020-05-08: qty 1

## 2020-05-08 MED ORDER — FENTANYL CITRATE (PF) 100 MCG/2ML IJ SOLN
INTRAMUSCULAR | Status: DC | PRN
  Administered 2020-05-08 (×2): 50 ug via INTRAVENOUS
  Administered 2020-05-08: 100 ug via INTRAVENOUS

## 2020-05-08 MED ORDER — AMISULPRIDE (ANTIEMETIC) 5 MG/2ML IV SOLN: 10.0000 mg | Freq: Once | INTRAVENOUS | Status: DC | PRN

## 2020-05-08 MED ORDER — FENTANYL CITRATE (PF) 100 MCG/2ML IJ SOLN
INTRAMUSCULAR | Status: AC
  Filled 2020-05-08: qty 2

## 2020-05-08 MED ORDER — ONDANSETRON HCL 4 MG/2ML IJ SOLN
INTRAMUSCULAR | Status: AC
  Filled 2020-05-08: qty 2

## 2020-05-08 MED ORDER — OXYCODONE HCL 5 MG PO TABS
ORAL_TABLET | ORAL | Status: AC
  Filled 2020-05-08: qty 1

## 2020-05-08 MED ORDER — MIDAZOLAM HCL 5 MG/5ML IJ SOLN
INTRAMUSCULAR | Status: DC | PRN
  Administered 2020-05-08: 2 mg via INTRAVENOUS

## 2020-05-08 MED ORDER — LIDOCAINE 2% (20 MG/ML) 5 ML SYRINGE
INTRAMUSCULAR | Status: AC
  Filled 2020-05-08: qty 5

## 2020-05-08 MED ORDER — ONDANSETRON HCL 4 MG/2ML IJ SOLN
INTRAMUSCULAR | Status: DC | PRN
  Administered 2020-05-08: 4 mg via INTRAVENOUS

## 2020-05-08 MED ORDER — HYDROCODONE-ACETAMINOPHEN 7.5-325 MG PO TABS: 1.0000 | ORAL_TABLET | Freq: Four times a day (QID) | ORAL | 0 refills | Status: AC | PRN

## 2020-05-08 MED ORDER — PHENYLEPHRINE 40 MCG/ML (10ML) SYRINGE FOR IV PUSH (FOR BLOOD PRESSURE SUPPORT)
PREFILLED_SYRINGE | INTRAVENOUS | Status: AC
  Filled 2020-05-08: qty 10

## 2020-05-08 MED ORDER — OXYCODONE HCL 5 MG PO TABS
5.0000 mg | ORAL_TABLET | Freq: Once | ORAL | Status: AC | PRN
  Administered 2020-05-08: 5 mg via ORAL

## 2020-05-08 MED ORDER — ROCURONIUM BROMIDE 10 MG/ML (PF) SYRINGE
PREFILLED_SYRINGE | INTRAVENOUS | Status: AC
  Filled 2020-05-08: qty 10

## 2020-05-08 MED ORDER — OXYCODONE HCL 5 MG/5ML PO SOLN: 5.0000 mg | Freq: Once | ORAL | Status: AC | PRN

## 2020-05-08 MED ORDER — CLINDAMYCIN HCL 300 MG PO CAPS: 300.0000 mg | ORAL_CAPSULE | Freq: Three times a day (TID) | ORAL | 0 refills | Status: AC

## 2020-05-08 MED ORDER — PROMETHAZINE HCL 25 MG RE SUPP: 25.0000 mg | Freq: Four times a day (QID) | RECTAL | 1 refills | Status: AC | PRN

## 2020-05-08 MED ORDER — DEXAMETHASONE SODIUM PHOSPHATE 10 MG/ML IJ SOLN
INTRAMUSCULAR | Status: DC | PRN
  Administered 2020-05-08: 5 mg via INTRAVENOUS

## 2020-05-08 MED ORDER — LACTATED RINGERS IV SOLN: INTRAVENOUS | Status: DC | PRN

## 2020-05-08 MED ORDER — PROPOFOL 10 MG/ML IV BOLUS
INTRAVENOUS | Status: AC
  Filled 2020-05-08: qty 40

## 2020-05-08 MED ORDER — EPHEDRINE 5 MG/ML INJ
INTRAVENOUS | Status: AC
  Filled 2020-05-08: qty 10

## 2020-05-08 MED ORDER — OXYMETAZOLINE HCL 0.05 % NA SOLN
NASAL | Status: AC
  Filled 2020-05-08: qty 30

## 2020-05-08 MED ORDER — MIDAZOLAM HCL 2 MG/2ML IJ SOLN
INTRAMUSCULAR | Status: AC
  Filled 2020-05-08: qty 2

## 2020-05-08 MED ORDER — ROCURONIUM BROMIDE 100 MG/10ML IV SOLN
INTRAVENOUS | Status: DC | PRN
  Administered 2020-05-08: 40 mg via INTRAVENOUS

## 2020-05-08 MED ORDER — DEXMEDETOMIDINE (PRECEDEX) IN NS 20 MCG/5ML (4 MCG/ML) IV SYRINGE
PREFILLED_SYRINGE | INTRAVENOUS | Status: AC
  Filled 2020-05-08: qty 10

## 2020-05-08 MED ORDER — FENTANYL CITRATE (PF) 100 MCG/2ML IJ SOLN
25.0000 ug | INTRAMUSCULAR | Status: DC | PRN
  Administered 2020-05-08: 25 ug via INTRAVENOUS

## 2020-05-08 MED ORDER — LIDOCAINE 2% (20 MG/ML) 5 ML SYRINGE
INTRAMUSCULAR | Status: DC | PRN
  Administered 2020-05-08: 60 mg via INTRAVENOUS

## 2020-05-08 MED ORDER — 0.9 % SODIUM CHLORIDE (POUR BTL) OPTIME
TOPICAL | Status: DC | PRN
  Administered 2020-05-08: 75 mL

## 2020-05-08 MED ORDER — ONDANSETRON HCL 4 MG/2ML IJ SOLN: 4.0000 mg | Freq: Once | INTRAMUSCULAR | Status: DC | PRN

## 2020-05-08 MED ORDER — SUGAMMADEX SODIUM 200 MG/2ML IV SOLN
INTRAVENOUS | Status: DC | PRN
  Administered 2020-05-08: 200 mg via INTRAVENOUS

## 2020-05-08 MED ORDER — DEXAMETHASONE SODIUM PHOSPHATE 10 MG/ML IJ SOLN
INTRAMUSCULAR | Status: AC
  Filled 2020-05-08: qty 1

## 2020-05-08 MED ORDER — PHENYLEPHRINE HCL (PRESSORS) 10 MG/ML IV SOLN
INTRAVENOUS | Status: DC | PRN
  Administered 2020-05-08 (×4): 40 ug via INTRAVENOUS

## 2020-05-08 SURGICAL SUPPLY — 61 items
ATTRACTOMAT 16X20 MAGNETIC DRP (DRAPES) IMPLANT
BAND RUBBER #18 3X1/16 STRL (MISCELLANEOUS) IMPLANT
BENZOIN TINCTURE PRP APPL 2/3 (GAUZE/BANDAGES/DRESSINGS) IMPLANT
BLADE SURG 15 STRL LF DISP TIS (BLADE) ×1 IMPLANT
BLADE SURG 15 STRL SS (BLADE) ×1
CANISTER SUCT 1200ML W/VALVE (MISCELLANEOUS) ×2 IMPLANT
CLEANER CAUTERY TIP 5X5 PAD (MISCELLANEOUS) ×1 IMPLANT
CLIP VESOCCLUDE MED 6/CT (CLIP) IMPLANT
CLIP VESOCCLUDE SM WIDE 6/CT (CLIP) IMPLANT
CORD BIPOLAR FORCEPS 12FT (ELECTRODE) IMPLANT
COVER BACK TABLE 60X90IN (DRAPES) ×2 IMPLANT
COVER MAYO STAND STRL (DRAPES) ×2 IMPLANT
COVER SURGICAL LIGHT HANDLE (MISCELLANEOUS) ×2 IMPLANT
COVER WAND RF STERILE (DRAPES) IMPLANT
DECANTER SPIKE VIAL GLASS SM (MISCELLANEOUS) ×2 IMPLANT
DERMABOND ADVANCED (GAUZE/BANDAGES/DRESSINGS)
DERMABOND ADVANCED .7 DNX12 (GAUZE/BANDAGES/DRESSINGS) IMPLANT
DRAIN JACKSON RD 7FR 3/32 (WOUND CARE) IMPLANT
DRAIN PENROSE 1/4X12 LTX STRL (WOUND CARE) IMPLANT
DRAIN SNY 10 ROU (WOUND CARE) ×2 IMPLANT
DRAPE U-SHAPE 76X120 STRL (DRAPES) ×2 IMPLANT
ELECT COATED BLADE 2.86 ST (ELECTRODE) ×2 IMPLANT
ELECT REM PT RETURN 9FT ADLT (ELECTROSURGICAL) ×2
ELECTRODE REM PT RTRN 9FT ADLT (ELECTROSURGICAL) ×1 IMPLANT
EVACUATOR SILICONE 100CC (DRAIN) ×2 IMPLANT
FORCEPS BIPOLAR SPETZLER 8 1.0 (NEUROSURGERY SUPPLIES) IMPLANT
GAUZE 4X4 16PLY RFD (DISPOSABLE) IMPLANT
GAUZE SPONGE 4X4 12PLY STRL LF (GAUZE/BANDAGES/DRESSINGS) IMPLANT
GLOVE BIOGEL PI IND STRL 7.0 (GLOVE) ×2 IMPLANT
GLOVE BIOGEL PI INDICATOR 7.0 (GLOVE) ×2
GLOVE ECLIPSE 7.5 STRL STRAW (GLOVE) ×2 IMPLANT
GLOVE SURG SS PI 7.0 STRL IVOR (GLOVE) ×2 IMPLANT
GOWN STRL REUS W/ TWL LRG LVL3 (GOWN DISPOSABLE) ×1 IMPLANT
GOWN STRL REUS W/ TWL XL LVL3 (GOWN DISPOSABLE) ×1 IMPLANT
GOWN STRL REUS W/TWL LRG LVL3 (GOWN DISPOSABLE) ×1
GOWN STRL REUS W/TWL XL LVL3 (GOWN DISPOSABLE) ×1
NEEDLE PRECISIONGLIDE 27X1.5 (NEEDLE) ×2 IMPLANT
NS IRRIG 1000ML POUR BTL (IV SOLUTION) ×2 IMPLANT
PACK BASIN DAY SURGERY FS (CUSTOM PROCEDURE TRAY) ×2 IMPLANT
PAD CLEANER CAUTERY TIP 5X5 (MISCELLANEOUS) ×1
PENCIL FOOT CONTROL (ELECTRODE) ×2 IMPLANT
PIN SAFETY STERILE (MISCELLANEOUS) ×2 IMPLANT
SLEEVE SCD COMPRESS KNEE MED (MISCELLANEOUS) IMPLANT
SPONGE GAUZE 2X2 8PLY STRL LF (GAUZE/BANDAGES/DRESSINGS) IMPLANT
STAPLER VISISTAT 35W (STAPLE) IMPLANT
STRIP CLOSURE SKIN 1/2X4 (GAUZE/BANDAGES/DRESSINGS) IMPLANT
SUCTION FRAZIER HANDLE 10FR (MISCELLANEOUS)
SUCTION TUBE FRAZIER 10FR DISP (MISCELLANEOUS) IMPLANT
SUT CHROMIC 3 0 PS 2 (SUTURE) ×2 IMPLANT
SUT CHROMIC 4 0 P 3 18 (SUTURE) IMPLANT
SUT ETHILON 4 0 PS 2 18 (SUTURE) ×2 IMPLANT
SUT ETHILON 5 0 P 3 18 (SUTURE)
SUT NYLON ETHILON 5-0 P-3 1X18 (SUTURE) IMPLANT
SUT PLAIN 5 0 P 3 18 (SUTURE) IMPLANT
SUT SILK 4 0 TIES 17X18 (SUTURE) IMPLANT
SUT VICRYL 4-0 PS2 18IN ABS (SUTURE) IMPLANT
SYR BULB EAR ULCER 3OZ GRN STR (SYRINGE) IMPLANT
SYR CONTROL 10ML LL (SYRINGE) ×2 IMPLANT
TOWEL GREEN STERILE FF (TOWEL DISPOSABLE) ×2 IMPLANT
TRAY DSU PREP LF (CUSTOM PROCEDURE TRAY) ×2 IMPLANT
TUBE CONNECTING 20X1/4 (TUBING) ×2 IMPLANT

## 2020-05-08 NOTE — Transfer of Care (Signed)
Immediate Anesthesia Transfer of Care Note  Patient: Melinda Jones  Procedure(s) Performed: EXCISION POSTERIOR NECK MASS (N/A Neck)  Patient Location: PACU  Anesthesia Type:General  Level of Consciousness: drowsy  Airway & Oxygen Therapy: Patient Spontanous Breathing and Patient connected to face mask oxygen  Post-op Assessment: Report given to RN and Post -op Vital signs reviewed and stable  Post vital signs: Reviewed and stable  Last Vitals:  Vitals Value Taken Time  BP 145/91 05/08/20 0845  Temp 36.7 C 05/08/20 0845  Pulse 93 05/08/20 0859  Resp 13 05/08/20 0859  SpO2 99 % 05/08/20 0859  Vitals shown include unvalidated device data.  Last Pain:  Vitals:   05/08/20 0854  TempSrc:   PainSc: 3          Complications: No complications documented.

## 2020-05-08 NOTE — Interval H&P Note (Signed)
History and Physical Interval Note:  05/08/2020 7:09 AM  Melinda Jones  has presented today for surgery, with the diagnosis of [place].  The various methods of treatment have been discussed with the patient and family. After consideration of risks, benefits and other options for treatment, the patient has consented to  Procedure(s): EXCISION MASS NECK / POSTERIOR (N/A) as a surgical intervention.  The patient's history has been reviewed, patient examined, no change in status, stable for surgery.  I have reviewed the patient's chart and labs.  Questions were answered to the patient's satisfaction.     Izora Gala

## 2020-05-08 NOTE — Discharge Instructions (Signed)
Empty and recharge the drain 3 times daily.  Record the amount of output.  Keep everything clean and dry until the drain is removed.    Post Anesthesia Home Care Instructions  Activity: Get plenty of rest for the remainder of the day. A responsible individual must stay with you for 24 hours following the procedure.  For the next 24 hours, DO NOT: -Drive a car -Paediatric nurse -Drink alcoholic beverages -Take any medication unless instructed by your physician -Make any legal decisions or sign important papers.  Meals: Start with liquid foods such as gelatin or soup. Progress to regular foods as tolerated. Avoid greasy, spicy, heavy foods. If nausea and/or vomiting occur, drink only clear liquids until the nausea and/or vomiting subsides. Call your physician if vomiting continues.  Special Instructions/Symptoms: Your throat may feel dry or sore from the anesthesia or the breathing tube placed in your throat during surgery. If this causes discomfort, gargle with warm salt water. The discomfort should disappear within 24 hours.  If you had a scopolamine patch placed behind your ear for the management of post- operative nausea and/or vomiting:  1. The medication in the patch is effective for 72 hours, after which it should be removed.  Wrap patch in a tissue and discard in the trash. Wash hands thoroughly with soap and water. 2. You may remove the patch earlier than 72 hours if you experience unpleasant side effects which may include dry mouth, dizziness or visual disturbances. 3. Avoid touching the patch. Wash your hands with soap and water after contact with the patch.       JP Drain [employer] this sheet to all of your post-operative appointments while you have your drains.  Please measure your drains by CC's or ML's.  Make sure you drain and measure your JP Drains 2 or 3 times per day.  At the end of each day, add up totals for the left side and add up totals for the right  side.    ( 9 am )     ( 3 pm )        ( 9 pm )                Date L  R  L  R  L  R  Total L/R

## 2020-05-08 NOTE — Anesthesia Postprocedure Evaluation (Signed)
Anesthesia Post Note  Patient: Melinda Jones  Procedure(s) Performed: EXCISION POSTERIOR NECK MASS (N/A Neck)     Patient location during evaluation: PACU Anesthesia Type: General Level of consciousness: awake and alert Pain management: pain level controlled Vital Signs Assessment: post-procedure vital signs reviewed and stable Respiratory status: spontaneous breathing, nonlabored ventilation and respiratory function stable Cardiovascular status: blood pressure returned to baseline and stable Postop Assessment: no apparent nausea or vomiting Anesthetic complications: no   No complications documented.  Last Vitals:  Vitals:   05/08/20 0900 05/08/20 0915  BP: (!) 134/100 (!) 117/92  Pulse: 93 78  Resp: 13 15  Temp:  36.5 C  SpO2: 99% 99%    Last Pain:  Vitals:   05/08/20 0915  TempSrc:   PainSc: [place]

## 2020-05-08 NOTE — Op Note (Signed)
OPERATIVE REPORT  DATE OF SURGERY: 05/08/2020  PATIENT:  Melinda Jones,  34 y.o. female  PRE-OPERATIVE DIAGNOSIS:  POSTERIOR NECK LIPOMA  POST-OPERATIVE DIAGNOSIS:  POSTERIOR NECK LIPOMA  PROCEDURE:  Procedure(s): EXCISION POSTERIOR NECK MASS  SURGEON:  Beckie Salts, MD  ASSISTANTS: None  ANESTHESIA:   General   EBL: 30 ml  DRAINS: 10 French round JP  LOCAL MEDICATIONS USED:  None  SPECIMEN: Posterior neck lipoma  COUNTS:  Correct  PROCEDURE DETAILS: The patient was taken to the operating room and placed on the operating table in the supine position. Following induction of general endotracheal anesthesia, the patient was turned in the prone position and prepped and draped in a standard fashion.  The mass was identified at the midline in the lower neck.  A transverse incision was outlined with a marking pen approximately 5 cm in length.  Electrocautery was used to incise the skin and subcutaneous tissue.  Fatty tissue was immediately encountered.  Skin flaps were raised superiorly and inferiorly.  Self-retaining retractor was used for the remainder of the case.  The lipomatous appearing mass was dissected free of surrounding tissue.  The mass measures approximately 10 cm in diameter.  It was dissected off of the cervical musculature at the depths of the dissection.  The entire lesion was sent for pathologic evaluation.  Electrocautery was used to complete hemostasis.  The wound was irrigated with saline and suctioned.  Valsalva maneuver was used by anesthesia to assess for any further bleeding.  The drain was exited through a separate stab incision to the right of the incision and secured in place with a nylon suture.  The wound was closed in 2 layers using interrupted 3-0 chromic in the deep layer and running subcuticular 3-0 chromic.  Dermabond was used on the skin surface.  Patient was then turned back to the supine position awakened extubated and transferred to recovery in stable  condition.    PATIENT DISPOSITION:  To PACU, stable

## 2020-05-08 NOTE — Anesthesia Preprocedure Evaluation (Addendum)
Anesthesia Evaluation  Patient identified by MRN, date of birth, ID band Patient awake    Reviewed: Allergy & Precautions, NPO status , Patient's Chart, lab work & pertinent test results  History of Anesthesia Complications Negative for: history of anesthetic complications  Airway Mallampati: III  TM Distance: >3 FB Neck ROM: Full    Dental   Pulmonary neg pulmonary ROS,    Pulmonary exam normal        Cardiovascular negative cardio ROS Normal cardiovascular exam     Neuro/Psych  Headaches, negative psych ROS   GI/Hepatic negative GI ROS, Neg liver ROS,   Endo/Other  Hypothyroidism   Renal/GU negative Renal ROS  negative genitourinary   Musculoskeletal negative musculoskeletal ROS (+)   Abdominal   Peds  Hematology negative hematology ROS (+)   Anesthesia Other Findings   Reproductive/Obstetrics                           Anesthesia Physical Anesthesia Plan  ASA: II  Anesthesia Plan: General   Post-op Pain Management:    Induction: Intravenous  PONV Risk Score and Plan: 3 and Ondansetron, Dexamethasone, Treatment may vary due to age or medical condition and Midazolam  Airway Management Planned: Oral ETT  Additional Equipment: None  Intra-op Plan:   Post-operative Plan: Extubation in OR  Informed Consent: I have reviewed the patients History and Physical, chart, labs and discussed the procedure including the risks, benefits and alternatives for the proposed anesthesia with the patient or authorized representative who has indicated his/her understanding and acceptance.     Dental advisory given  Plan Discussed with:   Anesthesia Plan Comments:        Anesthesia Quick Evaluation

## 2020-05-08 NOTE — Anesthesia Procedure Notes (Signed)
Procedure Name: Intubation Date/Time: 05/08/2020 7:38 AM Performed by: Lavonia Dana, CRNA Pre-anesthesia Checklist: Patient identified, Emergency Drugs available, Suction available and Patient being monitored Patient Re-evaluated:Patient Re-evaluated prior to induction Oxygen Delivery Method: Circle system utilized Preoxygenation: Pre-oxygenation with 100% oxygen Induction Type: IV induction Ventilation: Mask ventilation without difficulty Laryngoscope Size: Mac and 3 Grade View: Grade I Tube type: Oral Tube size: 7.0 mm Number of attempts: 1 Airway Equipment and Method: Stylet and Bite block Placement Confirmation: ETT inserted through vocal cords under direct vision,  positive ETCO2 and breath sounds checked- equal and bilateral Secured at: 24 cm Tube secured with: Tape Dental Injury: Teeth and Oropharynx as per pre-operative assessment

## 2020-05-09 LAB — SURGICAL PATHOLOGY

## 2020-05-17 DIAGNOSIS — E890 Postprocedural hypothyroidism: Secondary | ICD-10-CM | POA: Diagnosis not present

## 2020-07-06 ENCOUNTER — Ambulatory Visit: Payer: BC Managed Care – PPO | Admitting: Neurology

## 2020-08-13 IMAGING — US US THYROID
1 series · 13 of 25 positions shown · non-contrast
Comparison: None.

CLINICAL DATA: Thyroid nodule on exam

EXAM:
THYROID ULTRASOUND
TECHNIQUE: Ultrasound examination of the thyroid gland and adjacent soft
tissues was performed.

[Series 1: us thyroid · 0.04mm/px · 13 of 44 slices shown]
[im 1/44]
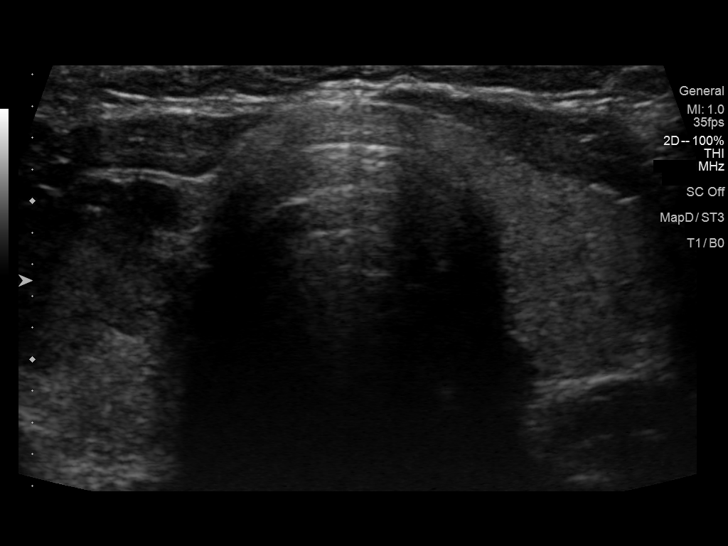
[im 4/44]
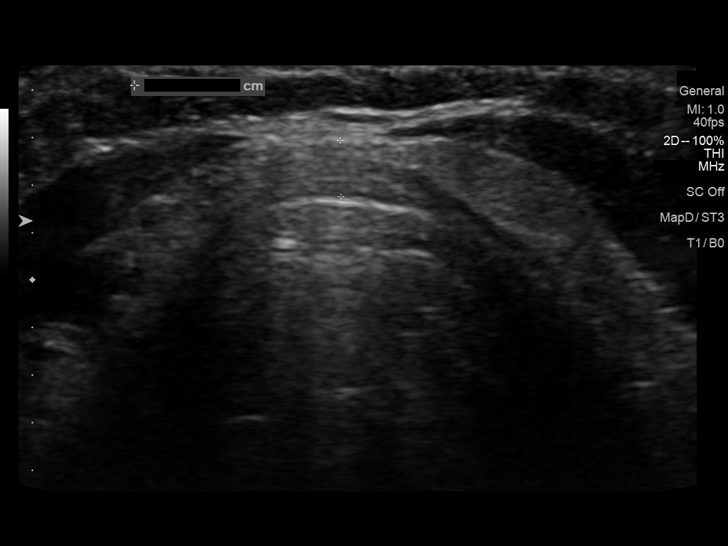
[im 8/44]
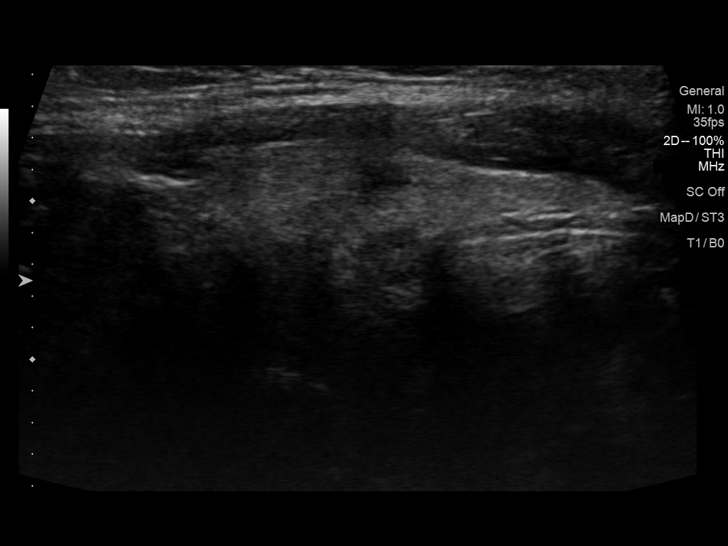
[im 11/44]
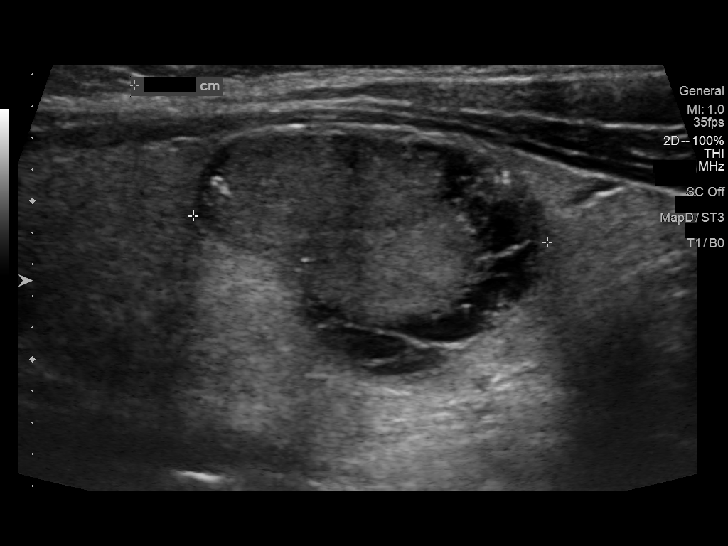
[im 15/44]
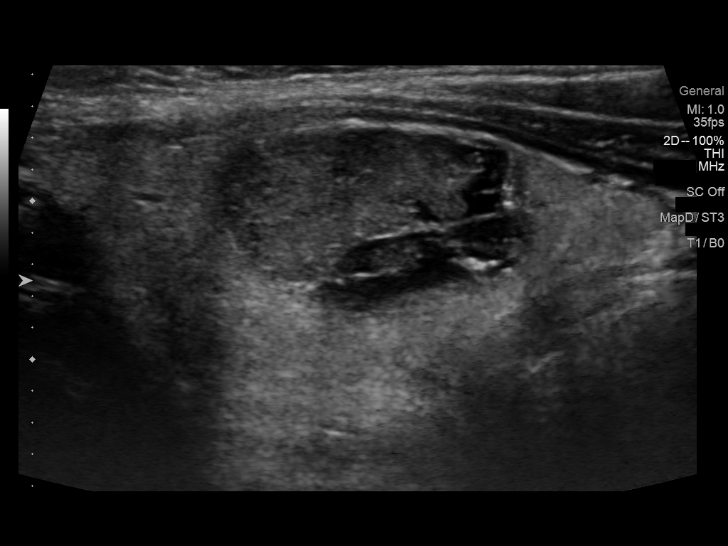
[im 18/44]
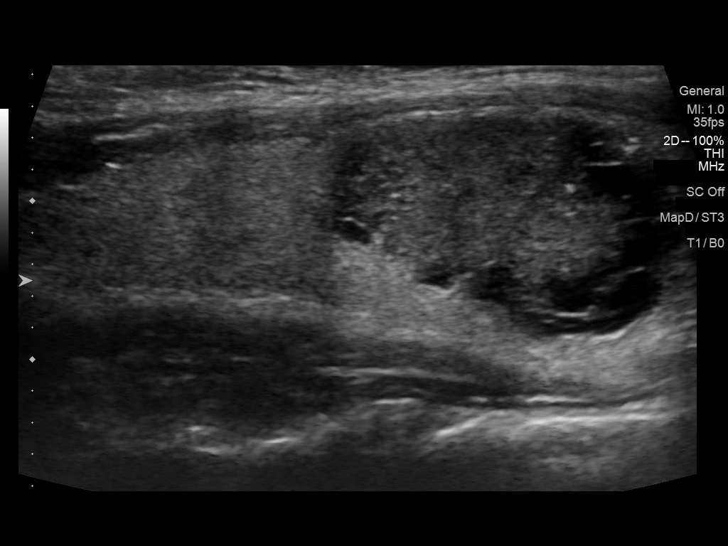
[im 22/44]
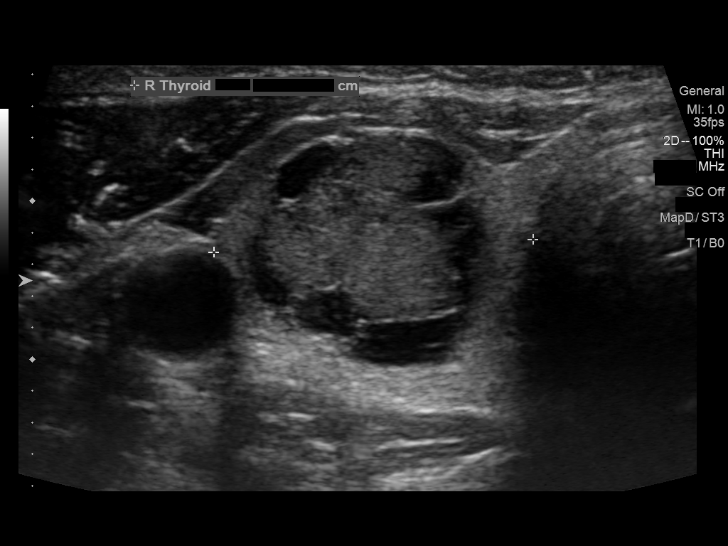
[im 26/44]
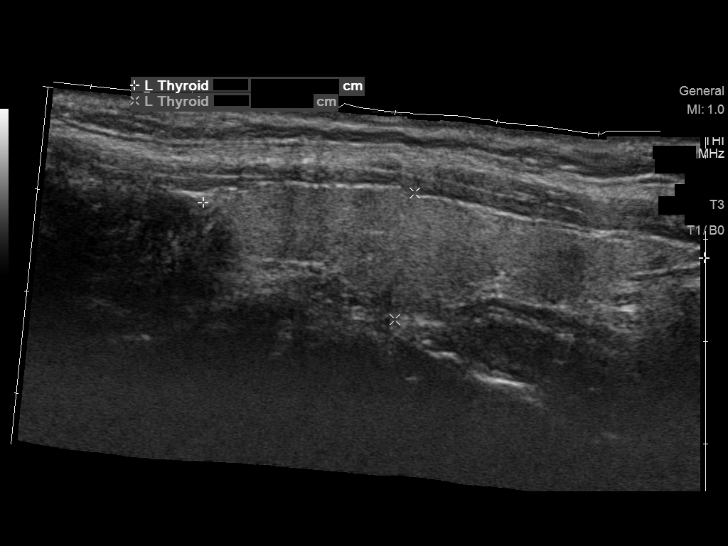
[im 29/44]
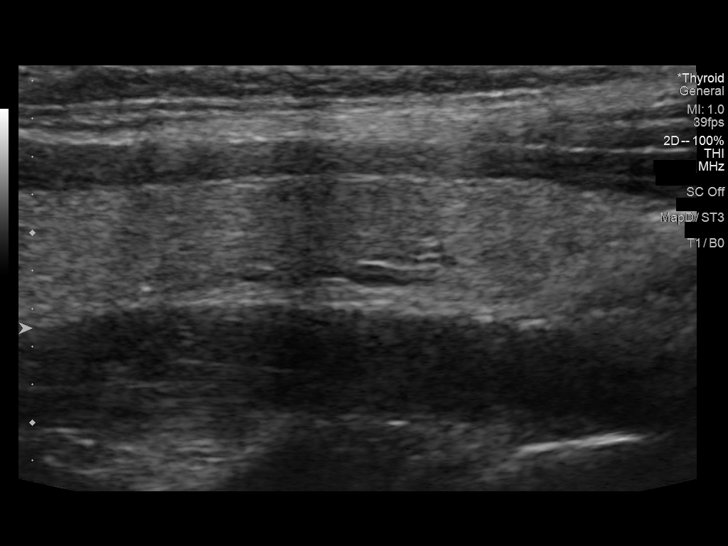
[im 33/44]
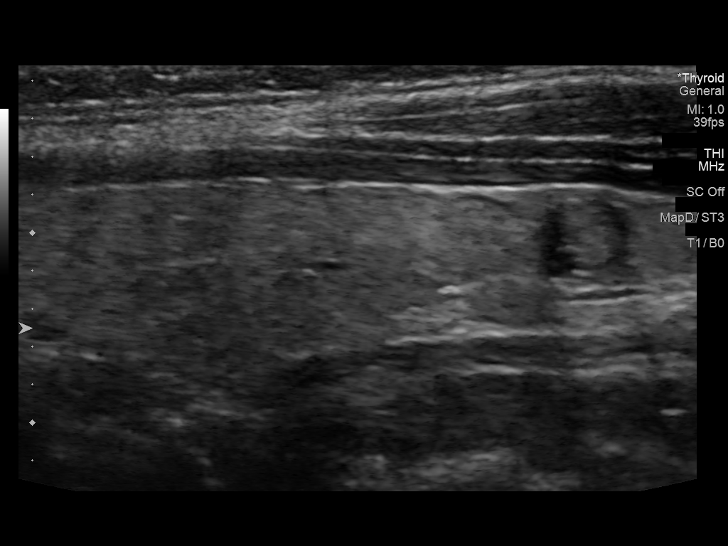
[im 36/44]
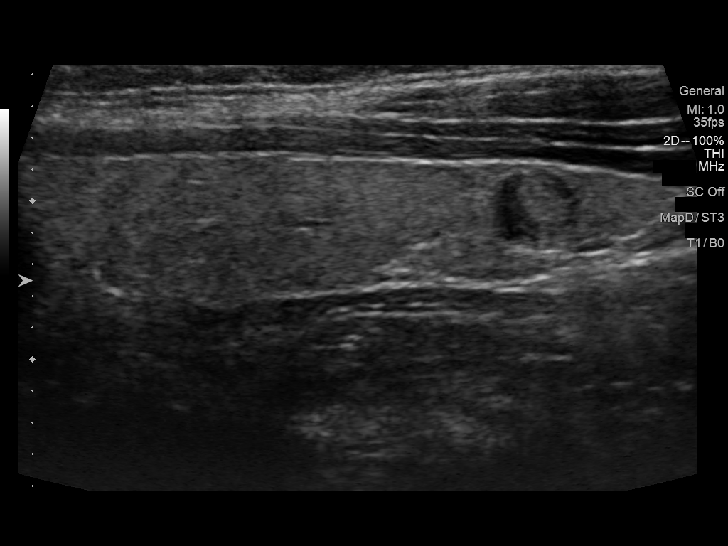
[im 40/44]
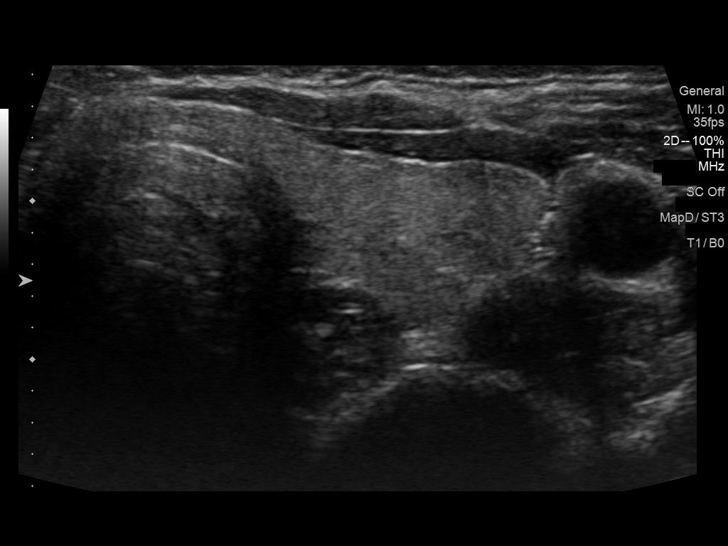
[im 44/44]
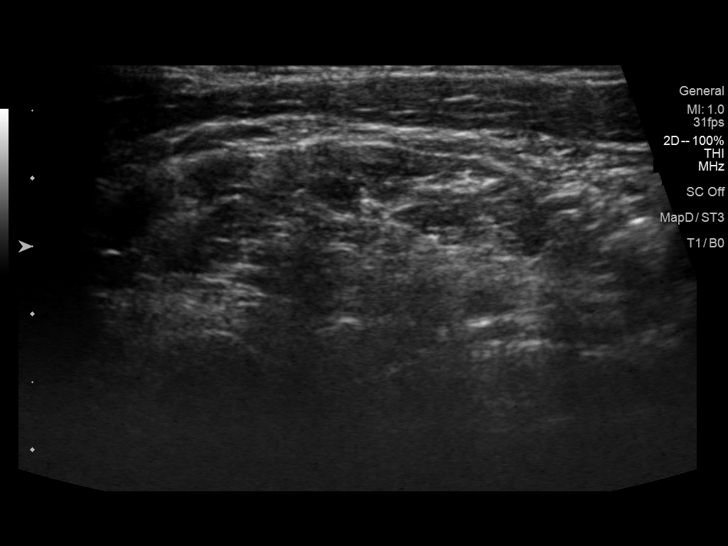

[13 of 25 positions shown; findings below may reference images not displayed]

FINDINGS: Parenchymal Echotexture: Mildly heterogenous

Isthmus: 2 mm

Right lobe: 4.7 x 2.1 x 2.0 cm

Left lobe: 4.9 x 1.3 x 1.7 cm

_________________________________________________________

Estimated total number of nodules >/= 1 cm: 1

Number of spongiform nodules >/=  2 cm not described below (TR1): 0

Number of mixed cystic and solid nodules >/= 1.5 cm not described
below (TR2): 0

_________________________________________________________

Nodule # 1:

Location: Right; Mid

Maximum size: 2.2 cm; Other 2 dimensions: 1.6 x 1.5 cm

Composition: solid/almost completely solid (2)

Echogenicity: hypoechoic (2)

Shape: not taller-than-wide (0)

Margins: ill-defined (0)

Echogenic foci: none (0)

ACR TI-RADS total points: 4.

ACR TI-RADS risk category: TR4 (4-6 points).

ACR TI-RADS recommendations:

**Given size (>/= 1.5 cm) and appearance, fine needle aspiration of
this moderately suspicious nodule should be considered based on
TI-RADS criteria.

_________________________________________________________

There is an additional subcentimeter solid hypoechoic nodule in the
left thyroid inferiorly measuring 6 mm. This would not meet criteria
for any biopsy or follow-up.

Normal vascularity. Nonspecific minor thyroid heterogeneity. No
regional adenopathy.
IMPRESSION: 2.2 cm right mid thyroid TR 4 nodule meets criteria for biopsy as
above.

The above is in keeping with the ACR TI-RADS recommendations - [HOSPITAL] 6858;[DATE].

## 2021-04-05 ENCOUNTER — Other Ambulatory Visit (HOSPITAL_COMMUNITY): Payer: Self-pay | Admitting: Endocrinology

## 2021-04-05 DIAGNOSIS — C73 Malignant neoplasm of thyroid gland: Secondary | ICD-10-CM

## 2021-04-05 NOTE — Written Directive (Addendum)
MOLECULAR IMAGING AND THERAPEUTICS WRITTEN DIRECTIVE   PATIENT NAME: Melinda Jones  PT DOB:   11/11/1985                                              MRN: [id number]  ---------------------------------------------------------------------------------------------------------------------  I-131 WHOLE BODY SCAN    RADIOPHARMACEUTICAL: Iodine-131 Capsule for Diagnostic Imaging   PRESCRIBED DOSE FOR ADMINISTRATION: 4 mCi   ROUTE OFADMINISTRATION: PO   DIAGNOSIS: Thyroid cancer   REFERRING PHYSICIAN:Balan   THYROGEN STIMULATION OR HORMONE WITHDRAW: Thyrogen   DATE OF THYROIDECTOMY:01/17/2020   SURGEON:Rosen   TSH:   No results found for: TSH   PRIOR I-131 THERAPY (Date and Dose): 49.6 mci   ADDITIONAL PHYSICIAN COMMENTS/NOTES   AUTHORIZED USER SIGNATURE & TIME STAMP: Rennis Golden, MD   04/06/21    11:58 AM

## 2021-04-09 ENCOUNTER — Encounter (HOSPITAL_COMMUNITY)
Admission: RE | Admit: 2021-04-09 | Discharge: 2021-04-09 | Disposition: A | Discharge status: Home / Self Care | Payer: BC Managed Care – PPO | Source: Ambulatory Visit | Attending: Endocrinology | Admitting: Endocrinology

## 2021-04-09 ENCOUNTER — Other Ambulatory Visit: Payer: Self-pay

## 2021-04-09 DIAGNOSIS — C73 Malignant neoplasm of thyroid gland: Secondary | ICD-10-CM | POA: Insufficient documentation

## 2021-04-09 MED ORDER — THYROTROPIN ALFA 0.9 MG IM SOLR
0.9000 mg | INTRAMUSCULAR | Status: AC
  Administered 2021-04-09: 0.9 mg via INTRAMUSCULAR

## 2021-04-10 ENCOUNTER — Encounter (HOSPITAL_COMMUNITY)
Admission: RE | Admit: 2021-04-10 | Discharge: 2021-04-10 | Disposition: A | Discharge status: Home / Self Care | Payer: BC Managed Care – PPO | Source: Ambulatory Visit | Attending: Endocrinology | Admitting: Endocrinology

## 2021-04-10 DIAGNOSIS — C73 Malignant neoplasm of thyroid gland: Secondary | ICD-10-CM | POA: Diagnosis not present

## 2021-04-10 MED ORDER — STERILE WATER FOR INJECTION IJ SOLN
INTRAMUSCULAR | Status: AC
  Filled 2021-04-10: qty 10

## 2021-04-10 MED ORDER — THYROTROPIN ALFA 0.9 MG IM SOLR
0.9000 mg | INTRAMUSCULAR | Status: AC
  Administered 2021-04-10: 0.9 mg via INTRAMUSCULAR

## 2021-04-11 ENCOUNTER — Encounter (HOSPITAL_COMMUNITY)
Admission: RE | Admit: 2021-04-11 | Discharge: 2021-04-11 | Disposition: A | Discharge status: Home / Self Care | Payer: BC Managed Care – PPO | Source: Ambulatory Visit | Attending: Endocrinology | Admitting: Endocrinology

## 2021-04-11 ENCOUNTER — Other Ambulatory Visit: Payer: Self-pay

## 2021-04-11 DIAGNOSIS — C73 Malignant neoplasm of thyroid gland: Secondary | ICD-10-CM | POA: Diagnosis not present

## 2021-04-11 LAB — HCG, SERUM, QUALITATIVE: Preg, Serum: NEGATIVE

## 2021-04-11 MED ORDER — SODIUM IODIDE I 131 CAPSULE
3.7000 | Freq: Once | INTRAVENOUS | Status: AC | PRN
  Administered 2021-04-11: 3.7 via ORAL

## 2021-04-13 ENCOUNTER — Encounter (HOSPITAL_COMMUNITY)
Admission: RE | Admit: 2021-04-13 | Discharge: 2021-04-13 | Disposition: A | Discharge status: Home / Self Care | Payer: BC Managed Care – PPO | Source: Ambulatory Visit | Attending: Endocrinology | Admitting: Endocrinology

## 2021-04-13 ENCOUNTER — Other Ambulatory Visit: Payer: Self-pay

## 2021-04-13 DIAGNOSIS — C73 Malignant neoplasm of thyroid gland: Secondary | ICD-10-CM | POA: Insufficient documentation

## 2021-04-13 MED ORDER — SODIUM IODIDE I 131 CAPSULE
3.7000 | Freq: Once | INTRAVENOUS | Status: AC | PRN
  Administered 2021-04-13: 3.7 via ORAL

## 2021-05-09 ENCOUNTER — Other Ambulatory Visit: Payer: Self-pay | Admitting: Endocrinology

## 2021-05-29 ENCOUNTER — Other Ambulatory Visit: Payer: Self-pay | Admitting: Endocrinology

## 2021-05-29 DIAGNOSIS — R599 Enlarged lymph nodes, unspecified: Secondary | ICD-10-CM

## 2021-06-19 ENCOUNTER — Other Ambulatory Visit: Payer: BC Managed Care – PPO

## 2021-06-22 ENCOUNTER — Ambulatory Visit
Admission: RE | Admit: 2021-06-22 | Discharge: 2021-06-22 | Disposition: A | Discharge status: Home / Self Care | Payer: BC Managed Care – PPO | Source: Ambulatory Visit | Attending: Endocrinology | Admitting: Endocrinology

## 2021-06-22 DIAGNOSIS — R599 Enlarged lymph nodes, unspecified: Secondary | ICD-10-CM

## 2021-07-13 ENCOUNTER — Other Ambulatory Visit: Payer: Self-pay | Admitting: Endocrinology

## 2021-07-13 DIAGNOSIS — C73 Malignant neoplasm of thyroid gland: Secondary | ICD-10-CM

## 2021-07-25 ENCOUNTER — Other Ambulatory Visit: Payer: BC Managed Care – PPO

## 2021-07-26 ENCOUNTER — Other Ambulatory Visit: Payer: Self-pay

## 2021-07-26 ENCOUNTER — Ambulatory Visit
Admission: RE | Admit: 2021-07-26 | Discharge: 2021-07-26 | Disposition: A | Discharge status: Home / Self Care | Payer: BC Managed Care – PPO | Source: Ambulatory Visit | Attending: Endocrinology | Admitting: Endocrinology

## 2021-07-26 DIAGNOSIS — C73 Malignant neoplasm of thyroid gland: Secondary | ICD-10-CM

## 2021-08-24 ENCOUNTER — Encounter (HOSPITAL_BASED_OUTPATIENT_CLINIC_OR_DEPARTMENT_OTHER): Payer: Self-pay | Admitting: Emergency Medicine

## 2021-08-24 ENCOUNTER — Other Ambulatory Visit: Payer: Self-pay

## 2021-08-24 ENCOUNTER — Emergency Department (HOSPITAL_BASED_OUTPATIENT_CLINIC_OR_DEPARTMENT_OTHER)
Admission: EM | Admit: 2021-08-24 | Discharge: 2021-08-24 | Disposition: A | Discharge status: Home / Self Care | Payer: BC Managed Care – PPO | Attending: Emergency Medicine | Admitting: Emergency Medicine

## 2021-08-24 ENCOUNTER — Emergency Department (HOSPITAL_BASED_OUTPATIENT_CLINIC_OR_DEPARTMENT_OTHER): Payer: BC Managed Care – PPO | Admitting: Radiology

## 2021-08-24 DIAGNOSIS — R739 Hyperglycemia, unspecified: Secondary | ICD-10-CM | POA: Diagnosis not present

## 2021-08-24 DIAGNOSIS — Z20822 Contact with and (suspected) exposure to covid-19: Secondary | ICD-10-CM | POA: Insufficient documentation

## 2021-08-24 DIAGNOSIS — H9190 Unspecified hearing loss, unspecified ear: Secondary | ICD-10-CM | POA: Insufficient documentation

## 2021-08-24 DIAGNOSIS — R053 Chronic cough: Secondary | ICD-10-CM | POA: Diagnosis not present

## 2021-08-24 DIAGNOSIS — R0602 Shortness of breath: Secondary | ICD-10-CM | POA: Diagnosis present

## 2021-08-24 DIAGNOSIS — R0609 Other forms of dyspnea: Secondary | ICD-10-CM

## 2021-08-24 LAB — BASIC METABOLIC PANEL
Anion gap: 11 (ref 5–15)
BUN: 14 mg/dL (ref 6–20)
CO2: 23 mmol/L (ref 22–32)
Calcium: 8.9 mg/dL (ref 8.9–10.3)
Chloride: 101 mmol/L (ref 98–111)
Creatinine, Ser: 0.6 mg/dL (ref 0.44–1.00)
GFR, Estimated: >60 mL/min (ref >60)
Glucose, Bld: 150 mg/dL — ABNORMAL HIGH (ref 70–99)
Potassium: 3.8 mmol/L (ref 3.5–5.1)
Sodium: 135 mmol/L (ref 135–145)

## 2021-08-24 LAB — CBC WITH DIFFERENTIAL/PLATELET
Abs Immature Granulocytes: 0.05 10*3/uL (ref 0.00–0.07)
Basophils Absolute: 0 10*3/uL (ref 0.0–0.1)
Basophils Relative: 0 %
Eosinophils Absolute: 0.1 10*3/uL (ref 0.0–0.5)
Eosinophils Relative: 0 %
HCT: 43.9 % (ref 36.0–46.0)
Hemoglobin: 14.5 g/dL (ref 12.0–15.0)
Immature Granulocytes: 0 %
Lymphocytes Relative: 19 %
Lymphs Abs: 2.6 10*3/uL (ref 0.7–4.0)
MCH: 29.6 pg (ref 26.0–34.0)
MCHC: 33 g/dL (ref 30.0–36.0)
MCV: 89.6 fL (ref 80.0–100.0)
Monocytes Absolute: 0.9 10*3/uL (ref 0.1–1.0)
Monocytes Relative: 7 %
Neutro Abs: 9.9 10*3/uL — ABNORMAL HIGH (ref 1.7–7.7)
Neutrophils Relative %: 74 %
Platelets: 314 10*3/uL (ref 150–400)
RBC: 4.9 MIL/uL (ref 3.87–5.11)
RDW: 12.5 % (ref 11.5–15.5)
WBC: 13.6 10*3/uL — ABNORMAL HIGH (ref 4.0–10.5)
nRBC: 0 % (ref 0.0–0.2)

## 2021-08-24 LAB — URINALYSIS, ROUTINE W REFLEX MICROSCOPIC
Bilirubin Urine: NEGATIVE
Glucose, UA: NEGATIVE mg/dL
Ketones, ur: NEGATIVE mg/dL
Nitrite: NEGATIVE
Specific Gravity, Urine: 1.027 (ref 1.005–1.030)
pH: 6 (ref 5.0–8.0)

## 2021-08-24 LAB — RESP PANEL BY RT-PCR (FLU A&B, COVID) ARPGX2
Influenza A by PCR: NEGATIVE
Influenza B by PCR: NEGATIVE
SARS Coronavirus 2 by RT PCR: NEGATIVE

## 2021-08-24 LAB — BRAIN NATRIURETIC PEPTIDE: B Natriuretic Peptide: 56.7 pg/mL (ref 0.0–100.0)

## 2021-08-24 LAB — TSH: TSH: 0.126 u[IU]/mL — ABNORMAL LOW (ref 0.350–4.500)

## 2021-08-24 LAB — PREGNANCY, URINE: Preg Test, Ur: NEGATIVE

## 2021-08-24 LAB — D-DIMER, QUANTITATIVE: D-Dimer, Quant: <0.27 ug/mL-FEU (ref 0.00–0.50)

## 2021-08-24 LAB — T4, FREE: Free T4: 1.02 ng/dL (ref 0.61–1.12)

## 2021-08-24 LAB — TROPONIN I (HIGH SENSITIVITY): Troponin I (High Sensitivity): 2 ng/L (ref <18)

## 2021-08-24 NOTE — Discharge Instructions (Signed)
Your work up shows no concerning abnormalities. ?Continue taking the medications you were prescribed and follow up in the office with your pcp for further evaluation of your shortness of breath. ?Get help right away if: ?Your shortness of breath gets worse. ?You have shortness of breath when you are resting. ?You feel light-headed or you faint. ?You have a cough that is not controlled with medicines. ?You cough up blood. ?You have pain with breathing. ?You have pain in your chest, arms, shoulders, or abdomen. ?You have a fever. ?

## 2021-08-24 NOTE — ED Provider Notes (Signed)
[facility] EMERGENCY DEPT Provider Note   CSN: [id number] Arrival date & time: 08/24/21  1244     History  Chief Complaint  Patient presents with   Fatigue    Melinda Jones is a 36 y.o. female who presents with c/o sob. The patient reports onset of " a very aggressive" URI 1 month ago. She had improvement in her sxs but had persistent and lingering cough.  She was using her inhaler and felt like it was likely a reactive airway.  1 week ago she had recurrence of URI symptoms, high fever and felt markedly short of breath.  This was in [place].  The patient is an [employer] and frequently travels.  She also takes oral contraceptive pills for management of endometriosis.  She denies a history of blood clots and denies any unilateral leg swelling.  She denies chest pain but complains of severe exertional dyspnea.  She states that 2 weeks ago she was able to hike mountains in [place] and now when she walks around her house a couple time she is feeling extremely winded.  She did call her PCP and was started on azithromycin, albuterol, inhaled budesonide, and oral steroids and states that she feels somewhat improved but is still feeling significant exertional dyspnea.  She denies orthopnea or PND.  She has persistent cough.  She has decreased hearing and sensation of facial fullness.  She denies fevers  HPI     Home Medications Prior to Admission medications   Medication Sig Start Date End Date Taking? Authorizing Provider  acetaminophen (TYLENOL) 325 MG tablet Take 650 mg by mouth every 6 (six) hours as needed for moderate pain.    [provider]  clindamycin (CLEOCIN) 300 MG capsule Take 1 capsule (300 mg total) by mouth 3 (three) times daily. 05/08/20   Izora Gala, MD  fluticasone (FLONASE) 50 MCG/ACT nasal spray Place 1 spray into both nostrils daily as needed for allergies or rhinitis.    [provider]  HYDROcodone-acetaminophen  (NORCO) 7.5-325 MG tablet Take 1 tablet by mouth every 6 (six) hours as needed for moderate pain. 05/08/20   Izora Gala, MD  levonorgestrel-ethinyl estradiol (AMETHYST) 90-20 MCG tablet Take 1 tablet by mouth daily.  10/25/19   [provider]  levothyroxine (SYNTHROID) 100 MCG tablet Take 1 tablet (100 mcg total) by mouth daily. 01/18/20   Izora Gala, MD  loratadine (CLARITIN) 10 MG tablet Take 10 mg by mouth daily.    [provider]  promethazine (PHENERGAN) 25 MG suppository Place 1 suppository (25 mg total) rectally every 6 (six) hours as needed for nausea or vomiting. 01/18/20   Izora Gala, MD  promethazine (PHENERGAN) 25 MG suppository Place 1 suppository (25 mg total) rectally every 6 (six) hours as needed for nausea or vomiting. 05/08/20   Izora Gala, MD  rizatriptan (MAXALT-MLT) 10 MG disintegrating tablet Take 1 tablet (10 mg total) by mouth as needed for migraine. May repeat in 2 hours if needed 12/30/19   Melvenia Beam, MD  S-Adenosylmethionine (SAM-E PO) Take by mouth. Patient not taking: Reported on 01/06/2020    [provider]      Allergies    Amoxicillin-pot clavulanate, Cefaclor, and Iodine    Review of Systems   Review of Systems  Constitutional:  Negative for fever.  HENT:  Positive for congestion and sinus pressure.   Respiratory:  Positive for cough, chest tightness and shortness of breath.   Cardiovascular:  Negative for  chest pain, palpitations and leg swelling.   Physical Exam Updated Vital Signs BP (!) 139/92 (BP Location: Right Arm)    Pulse 69    Temp 98.7 F (37.1 C) (Oral)    Resp 18    Ht '5\' 9"'$  (1.753 m)    Wt 61.2 kg    SpO2 99%    BMI 19.94 kg/m  Physical Exam Vitals and nursing note reviewed.  Constitutional:      General: She is not in acute distress.    Appearance: She is well-developed. She is not diaphoretic.  HENT:     Head: Normocephalic and atraumatic.     Right Ear: Tympanic membrane and external ear normal.      Left Ear: Tympanic membrane and external ear normal.     Nose: Nose normal.     Mouth/Throat:     Mouth: Mucous membranes are moist.  Eyes:     General: No scleral icterus.    Conjunctiva/sclera: Conjunctivae normal.  Cardiovascular:     Rate and Rhythm: Normal rate and regular rhythm.     Heart sounds: Normal heart sounds. No murmur heard.   No friction rub. No gallop.  Pulmonary:     Effort: Pulmonary effort is normal. No respiratory distress.     Breath sounds: Normal breath sounds.  Abdominal:     General: Bowel sounds are normal. There is no distension.     Palpations: Abdomen is soft. There is no mass.     Tenderness: There is no abdominal tenderness. There is no guarding.  Musculoskeletal:     Cervical back: Normal range of motion.     Right lower leg: No edema.     Left lower leg: No edema.  Skin:    General: Skin is warm and dry.  Neurological:     General: No focal deficit present.     Mental Status: She is alert and oriented to person, place, and time.  Psychiatric:        Behavior: Behavior normal.    ED Results / Procedures / Treatments   Labs (all labs ordered are listed, but only abnormal results are displayed) Labs Reviewed  BASIC METABOLIC PANEL - Abnormal; Notable for the following components:      Result Value   Glucose, Bld 150 (*)    All other components within normal limits  CBC WITH DIFFERENTIAL/PLATELET - Abnormal; Notable for the following components:   WBC 13.6 (*)    Neutro Abs 9.9 (*)    All other components within normal limits  URINALYSIS, ROUTINE W REFLEX MICROSCOPIC - Abnormal; Notable for the following components:   APPearance HAZY (*)    Hgb urine dipstick TRACE (*)    Protein, ur TRACE (*)    Leukocytes,Ua SMALL (*)    Bacteria, UA MANY (*)    All other components within normal limits  RESP PANEL BY RT-PCR (FLU A&B, COVID) ARPGX2  PREGNANCY, URINE  BRAIN NATRIURETIC PEPTIDE  D-DIMER, QUANTITATIVE  TSH  T4, FREE  TROPONIN  I (HIGH SENSITIVITY)  TROPONIN I (HIGH SENSITIVITY)    EKG EKG Interpretation  Date/Time:  Friday August 24 2021 13:56:02 EST Ventricular Rate:  61 PR Interval:  140 QRS Duration: 86 QT Interval:  396 QTC Calculation: 399 R Axis:   66 Text Interpretation: Sinus rhythm LAE, consider biatrial enlargement Baseline wander in lead(s) V1 V2 V3 V4 No old tracing to compare Confirmed by Nanda Quinton [phone]) on 08/24/2021 2:04:55 PM  Radiology DG Chest 2 View  Result Date: 08/24/2021 CLINICAL DATA:  Dyspnea on exertion.  Fatigue.  Cough and headache. EXAM: CHEST - 2 VIEW COMPARISON:  None. FINDINGS: Numerous leads and wires project over the chest. Midline trachea. Normal heart size and mediastinal contours. No pleural effusion or pneumothorax. Clear lungs. IMPRESSION: No active cardiopulmonary disease. Electronically Signed   By: Anastasia Tompson Miyamoto M.D.   On: 08/24/2021 14:13    Procedures Procedures   Medications Ordered in ED Medications - No data to display  ED Course/ Medical Decision Making/ A&P Clinical Course as of 08/24/21 1941  Fri Aug 24, 2021  1423 DG Chest 2 View I visualized images of the CXR- no acute findings [AH]  1424 EKG shows sinus rhythm at a rate of 61 [AH]  1424 Resp Panel by RT-PCR (Flu A&B, Covid) Nasopharyngeal Swab Flu negative [AH]  1062 Basic metabolic panel(!) BMP with glucose of 150- patient is on steroids [AH]  1425 D-Dimer, Quant: <0.27 Negative dimer- low suspicion for PE [AH]  1454 Urinalysis, Routine w reflex microscopic Urine, Clean Catch(!) Urine appears contaminated  [AH]    Clinical Course User Index [AH] Margarita Mail, PA-C                           Medical Decision Making Paitent here w/ sob The emergent differential diagnosis for shortness of breath includes, but is not limited to, Pulmonary edema, bronchoconstriction, Pneumonia, Pulmonary embolism, Pneumotherax/ Hemothorax, Dysrythmia, ACS.  Patient lung examination clear.  No signs of  volume overload or pulmonary edema.  Patient has a negative D-dimer and I doubt pulmonary embolus.  She does not appear to have ACS.  She was able to ambulate in the emergency department with oxygen saturations 97 to 98%.  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  Patient is already on multiple medications.  I do not think she needs any additional at this point.  She is advised to have an office visit with her PCP should her symptoms worsen or not improve.  She appears otherwise appropriate for discharge at this time   Amount and/or Complexity of Data Reviewed Labs: ordered. Decision-making details documented in ED Course. Radiology: ordered and independent interpretation performed. Decision-making details documented in ED Course. ECG/medicine tests: ordered and independent interpretation performed.    Details: Sinus rhythm at a rate of 61    Final Clinical Impression(s) / ED Diagnoses Final diagnoses:  Exertional dyspnea    Rx / DC Orders ED Discharge Orders     None         Margarita Mail, PA-C 08/24/21 1943    Margette Fast, MD 08/25/21 2101

## 2021-08-24 NOTE — ED Notes (Signed)
Pt ambulated around department on Pulse Ox w/o O2. Pt started at 98%. Pt stayed 97%-99% during ambulation. Pt back settled in bed and Pulse Ox is currently 100%. ?

## 2021-08-24 NOTE — ED Triage Notes (Signed)
Pt arrives to ED with c/o fatigue. Associated symptoms include congestion, ear stuffiness, cough, headache, cough. Pt reports these symptoms have been on-going for over 1 month. She reports over the past three days she has felt SOB and experienced chest tightness. She has taken antibiotics (Azithromycin x2), steroids, and albuterol inhaler without relief. Hx thyroid cancer, hx thyroidectomy.  ?

## 2021-11-14 ENCOUNTER — Ambulatory Visit (HOSPITAL_COMMUNITY)
Admission: RE | Admit: 2021-11-14 | Discharge: 2021-11-14 | Disposition: A | Discharge status: Home / Self Care | Payer: BC Managed Care – PPO | Source: Ambulatory Visit | Attending: Family Medicine | Admitting: Family Medicine

## 2021-11-14 ENCOUNTER — Other Ambulatory Visit (HOSPITAL_COMMUNITY): Payer: Self-pay | Admitting: Family Medicine

## 2021-11-14 DIAGNOSIS — Y9389 Activity, other specified: Secondary | ICD-10-CM | POA: Insufficient documentation

## 2021-11-14 DIAGNOSIS — S233XXA Sprain of ligaments of thoracic spine, initial encounter: Secondary | ICD-10-CM | POA: Insufficient documentation

## 2022-03-06 ENCOUNTER — Other Ambulatory Visit (HOSPITAL_COMMUNITY): Payer: Self-pay | Admitting: Endocrinology

## 2022-03-06 DIAGNOSIS — E0590 Thyrotoxicosis, unspecified without thyrotoxic crisis or storm: Secondary | ICD-10-CM

## 2022-03-06 DIAGNOSIS — C73 Malignant neoplasm of thyroid gland: Secondary | ICD-10-CM

## 2022-03-07 NOTE — Written Directive (Cosign Needed)
MOLECULAR IMAGING AND THERAPEUTICS WRITTEN DIRECTIVE   PATIENT NAME: Melinda Jones  PT DOB:   06-Sep-1985                                              MRN: [id number]  ---------------------------------------------------------------------------------------------------------------------   I-131 THYROID CANCER THERAPY   RADIOPHARMACEUTICAL:  Iodine-131 Capsule    PRESCRIBED DOSE FOR ADMINISTRATION:    ROUTE OFADMINISTRATION:  PO   DIAGNOSIS: Thyroid Cancer   REFERRING PHYSICIAN: Dr. Arville Care STIMULATION OR HORMONE WITHDRAW: Thyrogen Stimulated    REMNANT ABLATION OR ADJUVANT THERAPY: Adjuvant Therapy   DATE OF THYROIDECTOMY: 01/17/1920     SURGEON: Dr. Constance Holster   TSH:   Lab Results  Component Value Date   TSH 0.126 (L) 08/24/2021     PRIOR I-131 THERAPY (Date and Dose):    Pathology:  Cell type: '[]'$   Papillary  '[]'$   Follicular  '[]'$   [place]   Largest tumor focus:      cm  Extrathyroidal Extension?     Yes '[]'$   No '[]'$     Lymphovascular Invasion?  Yes  '[]'$   No  '[]'$     Margins positive ? Yes '[]'$   No '[]'$     Lymph nodes positive? Yes '[]'$   No  '[]'$       # positive nodes:   # negative nodes:     TNM staging: pT:         PN:         Mx:    ADDITIONAL PHYSICIAN COMMENTS/NOTES   AUTHORIZED USER SIGNATURE & TIME STAMP:

## 2022-03-11 NOTE — Written Directive (Addendum)
MOLECULAR IMAGING AND THERAPEUTICS WRITTEN DIRECTIVE   PATIENT NAME: Melinda Jones  PT DOB:   03-03-1986                                              MRN: [id number]  ---------------------------------------------------------------------------------------------------------------------  I-131 WHOLE BODY SCAN    RADIOPHARMACEUTICAL: Iodine-131 Capsule for Diagnostic Imaging   PRESCRIBED DOSE FOR ADMINISTRATION: 4 mCi   ROUTE OFADMINISTRATION: PO   DIAGNOSIS:  thyroid cancer   REFERRING PHYSICIAN: Dr. Arville Care STIMULATION OR HORMONE WITHDRAW: Thyrogen Stimulated   DATE OF THYROIDECTOMY: 01/17/2020   SURGEON: Dr. Constance Holster   TSH:   Lab Results  Component Value Date   TSH 0.126 (L) 08/24/2021     PRIOR I-131 THERAPY (Date and Dose):   ADDITIONAL PHYSICIAN COMMENTS/NOTES   AUTHORIZED USER SIGNATURE & TIME STAMP:

## 2022-03-18 ENCOUNTER — Other Ambulatory Visit (HOSPITAL_COMMUNITY): Payer: BC Managed Care – PPO

## 2022-03-18 ENCOUNTER — Encounter (HOSPITAL_COMMUNITY)
Admission: RE | Admit: 2022-03-18 | Discharge: 2022-03-18 | Disposition: A | Discharge status: Home / Self Care | Payer: BC Managed Care – PPO | Source: Ambulatory Visit | Attending: Endocrinology | Admitting: Endocrinology

## 2022-03-18 DIAGNOSIS — E0590 Thyrotoxicosis, unspecified without thyrotoxic crisis or storm: Secondary | ICD-10-CM | POA: Diagnosis present

## 2022-03-18 DIAGNOSIS — C73 Malignant neoplasm of thyroid gland: Secondary | ICD-10-CM | POA: Diagnosis present

## 2022-03-18 MED ORDER — THYROTROPIN ALFA 0.9 MG IM SOLR
INTRAMUSCULAR | Status: AC
  Filled 2022-03-18: qty 0.9

## 2022-03-18 MED ORDER — THYROTROPIN ALFA 0.9 MG IM SOLR
0.9000 mg | INTRAMUSCULAR | Status: AC
  Administered 2022-03-18: 0.9 mg via INTRAMUSCULAR

## 2022-03-19 ENCOUNTER — Encounter (HOSPITAL_COMMUNITY)
Admission: RE | Admit: 2022-03-19 | Discharge: 2022-03-19 | Disposition: A | Discharge status: Home / Self Care | Payer: BC Managed Care – PPO | Source: Ambulatory Visit | Attending: Endocrinology | Admitting: Endocrinology

## 2022-03-19 ENCOUNTER — Other Ambulatory Visit (HOSPITAL_COMMUNITY): Payer: BC Managed Care – PPO

## 2022-03-19 DIAGNOSIS — C73 Malignant neoplasm of thyroid gland: Secondary | ICD-10-CM | POA: Diagnosis not present

## 2022-03-19 MED ORDER — THYROTROPIN ALFA 0.9 MG IM SOLR
0.9000 mg | INTRAMUSCULAR | Status: AC
  Administered 2022-03-19: 0.9 mg via INTRAMUSCULAR

## 2022-03-19 MED ORDER — THYROTROPIN ALFA 0.9 MG IM SOLR
INTRAMUSCULAR | Status: AC
  Filled 2022-03-19: qty 0.9

## 2022-03-20 ENCOUNTER — Encounter (HOSPITAL_COMMUNITY)
Admission: RE | Admit: 2022-03-20 | Discharge: 2022-03-20 | Disposition: A | Discharge status: Home / Self Care | Payer: BC Managed Care – PPO | Source: Ambulatory Visit | Attending: Endocrinology | Admitting: Endocrinology

## 2022-03-20 ENCOUNTER — Other Ambulatory Visit (HOSPITAL_COMMUNITY): Payer: BC Managed Care – PPO

## 2022-03-20 DIAGNOSIS — C73 Malignant neoplasm of thyroid gland: Secondary | ICD-10-CM | POA: Diagnosis not present

## 2022-03-20 LAB — PREGNANCY, URINE: Preg Test, Ur: NEGATIVE

## 2022-03-20 MED ORDER — SODIUM IODIDE I 131 CAPSULE: 4.1700 | Freq: Once | INTRAVENOUS | Status: DC | PRN

## 2022-03-22 ENCOUNTER — Other Ambulatory Visit (HOSPITAL_COMMUNITY): Payer: BC Managed Care – PPO

## 2022-03-22 ENCOUNTER — Encounter (HOSPITAL_COMMUNITY)
Admission: RE | Admit: 2022-03-22 | Discharge: 2022-03-22 | Disposition: A | Discharge status: Home / Self Care | Payer: BC Managed Care – PPO | Source: Ambulatory Visit | Attending: Endocrinology | Admitting: Endocrinology
